# Patient Record
Sex: Male | Born: 1940 | Race: White | Hispanic: No | Marital: Single | Smoking: Former smoker
Health system: Southern US, Community
[De-identification: ages and names within clinical notes are randomized; demographics above are authoritative.]

## PROBLEM LIST (undated history)

## (undated) DIAGNOSIS — Z973 Presence of spectacles and contact lenses: Secondary | ICD-10-CM

## (undated) DIAGNOSIS — M1990 Unspecified osteoarthritis, unspecified site: Secondary | ICD-10-CM

## (undated) DIAGNOSIS — A809 Acute poliomyelitis, unspecified: Secondary | ICD-10-CM

## (undated) DIAGNOSIS — R509 Fever, unspecified: Secondary | ICD-10-CM

## (undated) DIAGNOSIS — G14 Postpolio syndrome: Secondary | ICD-10-CM

## (undated) DIAGNOSIS — I10 Essential (primary) hypertension: Secondary | ICD-10-CM

## (undated) DIAGNOSIS — G8220 Paraplegia, unspecified: Secondary | ICD-10-CM

## (undated) DIAGNOSIS — R238 Other skin changes: Secondary | ICD-10-CM

## (undated) HISTORY — PX: COLONOSCOPY: SHX174

## (undated) HISTORY — PX: URETHRAL DIVERTICULECTOMY: SHX2618

## (undated) HISTORY — DX: Essential (primary) hypertension: I10

## (undated) HISTORY — PX: APPENDECTOMY: SHX54

## (undated) HISTORY — DX: Presence of spectacles and contact lenses: Z97.3

## (undated) HISTORY — DX: Postpolio syndrome: G14

## (undated) HISTORY — PX: NECK SURGERY: SHX720

## (undated) HISTORY — DX: Fever, unspecified: R50.9

## (undated) HISTORY — PX: SPINE SURGERY: SHX786

## (undated) HISTORY — PX: TONSILLECTOMY AND ADENOIDECTOMY: SUR1326

## (undated) HISTORY — DX: Unspecified osteoarthritis, unspecified site: M19.90

## (undated) HISTORY — DX: Other skin changes: R23.8

## (undated) HISTORY — DX: Acute poliomyelitis, unspecified: A80.9

---

## 1998-11-29 ENCOUNTER — Emergency Department (HOSPITAL_COMMUNITY): Admission: EM | Admit: 1998-11-29 | Discharge: 1998-11-29 | Payer: Self-pay | Admitting: Emergency Medicine

## 1998-12-01 ENCOUNTER — Encounter: Payer: Self-pay | Admitting: Emergency Medicine

## 1998-12-01 ENCOUNTER — Emergency Department (HOSPITAL_COMMUNITY): Admission: EM | Admit: 1998-12-01 | Discharge: 1998-12-01 | Payer: Self-pay | Admitting: Emergency Medicine

## 1998-12-04 ENCOUNTER — Emergency Department (HOSPITAL_COMMUNITY): Admission: EM | Admit: 1998-12-04 | Discharge: 1998-12-04 | Payer: Self-pay | Admitting: Emergency Medicine

## 2000-10-25 ENCOUNTER — Other Ambulatory Visit: Admission: RE | Admit: 2000-10-25 | Discharge: 2000-10-25 | Payer: Self-pay | Admitting: Urology

## 2000-10-25 ENCOUNTER — Encounter (INDEPENDENT_AMBULATORY_CARE_PROVIDER_SITE_OTHER): Payer: Self-pay | Admitting: Specialist

## 2001-04-10 ENCOUNTER — Ambulatory Visit (HOSPITAL_COMMUNITY): Admission: RE | Admit: 2001-04-10 | Discharge: 2001-04-10 | Payer: Self-pay | Admitting: Gastroenterology

## 2001-04-10 ENCOUNTER — Encounter (INDEPENDENT_AMBULATORY_CARE_PROVIDER_SITE_OTHER): Payer: Self-pay | Admitting: Specialist

## 2003-03-03 ENCOUNTER — Encounter: Admission: RE | Admit: 2003-03-03 | Discharge: 2003-03-03 | Payer: Self-pay | Admitting: Neurology

## 2003-03-10 ENCOUNTER — Ambulatory Visit (HOSPITAL_BASED_OUTPATIENT_CLINIC_OR_DEPARTMENT_OTHER): Admission: RE | Admit: 2003-03-10 | Discharge: 2003-03-10 | Payer: Self-pay | Admitting: Neurology

## 2004-07-05 ENCOUNTER — Ambulatory Visit (HOSPITAL_COMMUNITY): Admission: RE | Admit: 2004-07-05 | Discharge: 2004-07-05 | Payer: Self-pay | Admitting: Gastroenterology

## 2006-08-11 ENCOUNTER — Inpatient Hospital Stay (HOSPITAL_COMMUNITY): Admission: EM | Admit: 2006-08-11 | Discharge: 2006-08-29 | Payer: Self-pay | Admitting: Emergency Medicine

## 2006-08-23 ENCOUNTER — Ambulatory Visit: Payer: Self-pay | Admitting: Physical Medicine & Rehabilitation

## 2006-10-24 ENCOUNTER — Ambulatory Visit (HOSPITAL_COMMUNITY): Admission: RE | Admit: 2006-10-24 | Discharge: 2006-10-24 | Payer: Self-pay | Admitting: Geriatric Medicine

## 2007-04-16 ENCOUNTER — Encounter: Admission: RE | Admit: 2007-04-16 | Discharge: 2007-05-08 | Payer: Self-pay | Admitting: Family Medicine

## 2007-10-07 ENCOUNTER — Encounter: Admission: RE | Admit: 2007-10-07 | Discharge: 2007-10-07 | Payer: Self-pay | Admitting: Neurosurgery

## 2008-01-02 ENCOUNTER — Encounter: Admission: RE | Admit: 2008-01-02 | Discharge: 2008-02-24 | Payer: Self-pay | Admitting: Neurology

## 2010-05-05 ENCOUNTER — Emergency Department (HOSPITAL_COMMUNITY): Admission: EM | Admit: 2010-05-05 | Discharge: 2009-06-09 | Payer: Self-pay | Admitting: Emergency Medicine

## 2010-08-14 LAB — PROTIME-INR
INR: 2.95 — ABNORMAL HIGH (ref 0.00–1.49)
Prothrombin Time: 30.5 seconds — ABNORMAL HIGH (ref 11.6–15.2)

## 2010-10-14 NOTE — Discharge Summary (Signed)
NAME:  CATALDO, COSGRIFF               ACCOUNT NO.:  [id number]   MEDICAL RECORD NO.:  [id number]          PATIENT TYPE:  INP   LOCATION:  3030                         FACILITY:  [facility]   PHYSICIAN:  Michelene Gardener, MD    DATE OF BIRTH:  1941/01/21   DATE OF ADMISSION:  08/10/2006  DATE OF DISCHARGE:  08/29/2006                               DISCHARGE SUMMARY   Primary physician is Anna Genre. Little, M.D.   DISCHARGE DIAGNOSES:  1. Multilevel spinal stenosis, status post neurosurgical surgery.  2. Aspiration pneumonia.  3. Dysphagia.  4. Hypertension.  5. Cord compression syndrome.  6. Benign prostatic hypertrophy.  7. History of poliomyelitis diagnosed at age 62-1/2 years, and he did      wear braces for a brief period of time and he had significant      muscle wasting related to this disorder.   DISCHARGE MEDICATIONS:  1. Albuterol inhaler one puff q.4.h. p.r.n.  2. Cyclosporine eye drops one drop to both eyes q.12h. for one week.  3. Ensure 113 mL p.o. three times daily.  4. Lisinopril 20 mg p.o. once daily.  5. Multivitamin one tablet p.o. once daily.  6. Protonix 40 mg p.o. once daily.  7. Levaquin 500 mg p.o. once daily x5 days.  8. Artificial tears one drop both eyes three times daily p.r.n.  9. Percocet 5/325 mg p.o. q.4h. p.r.n.  10.Finasteride 5 mg p.o. once daily.   CONSULTATIONS:  1. Neurology consultation with Melvyn Novas, M.D.  2. Neurosurgical consultation with Cristi Loron, M.D.   FOLLOW-UP APPOINTMENT:  1. With the primary physician, Dr. Catha Gosselin, in 1-2 weeks.  2. Neurosurgery, Dr. Tressie Stalker, in 1-2 weeks.   PROCEDURES:  Extensive anterior four-level anterior cervical diskectomy  and decompression with a plate from C3 up to C7, done by Dr. Tressie Stalker on March 20.   RADIOLOGY STUDIES DURING HOSPITALIZATION:  1. A CT scan of the head without contrast done on March 14, and it      showed multilevel degenerative changes with moderate  spinal      stenosis at multiple levels with no evidence of fracture.  2. CT scan of the spine done on March 14 showed the same finding with      multilevel degenerative changes consistent with moderate spinal      stenosis at multiple levels.  3. CT scan of the head without contrast showed a small right frontal      scalp hematoma without skull fracture or intracranial bleed.  4. MRI of the spine without contrast done on March 15.  It showed      central canal stenosis with severe narrowing at C4-5, C5-6, and to      a lesser degree in C3-4, C6-7.  5. Chest x-ray done on March 20 showed no active disease.  6. C-spine x-ray done on March 20 showed anterior diskectomy with      fusion from C3 up to C7.  7. Swallowing evaluation done on March 24, and the patient was started      on pureed diet.  8. Abdominal x-ray on March 25 showed a feeding tube in place.  9. Chest x-ray on March 28 that showed no acute findings with evidence      of thoracolumbar scoliosis.  10.Repeat swallowing study done on March 28 and, again, the patient      was advised on pureed.  11.Chest portable done on March 30 that showed a right infiltrate.   DIET:  A pureed diet.   ACTIVITY:  Physical therapy.   COURSE OF HOSPITALIZATION BY MEDICAL PROBLEM:  Problem 1.  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  Prior to his admission he was having acute quadriparesis and severe  weakness in his arms and other symptoms which are consistent with  central cord syndrome.  The patient was presented to the hospital, was  admitted and evaluated with multiple imaging including CT scan and MRI  of his spine, and the results were mentioned above.  The patient was  evaluated by neurology and neurosurgery and then was taken to the OR,  where he had extensive anterior  cervical diskectomy with decompression  from C3 up to C7.  He also had anterior cervical plate in the same  levels.  The patient was monitored in the hospital, had swallow speech  done twice on him, where he passed the test and was started on pureed  diet.  We will recommend to give him a strict pureed diet because of the  risk of aspiration.  The patient was also started on physical therapy.  Today he was cleared by neurosurgery to go a nursing facility, and he  will be discharged on the above-mentioned medications.   Problem 2.  ASPIRATION PNEUMONIA:  As mentioned, the patient had two  swallowing studies.  The first one was done on March 24 and the second  one was on March 28.  He was recommended to have a pureed diet but he  still developed aspiration pneumonia.  The patient remained afebrile for  a few days and his white count is normal.  We recommend to give him  Levaquin for 5 more days.   Problem 3.  DYSPHAGIA:  As mentioned above, this patient underwent a  swallow evaluation and finally he was recommended a pureed diet.  We  recommend to give him a strict pureed diet for the time being because of  the risk of developing pneumonia.   Problem 4.  BENIGN PROSTATIC HYPERTROPHY:  He will continue on  finasteride.   Problem 5.  HYPERTENSION:  The patient will continue the same  medications that he was taking.      Michelene Gardener, MD  Electronically Signed     NAE/MEDQ  D:  08/29/2006  T:  08/29/2006  Job:  [id number]   cc:   Caryn Bee L. Little, M.D.  Cristi Loron, M.D.  Melvyn Novas, M.D.

## 2010-10-14 NOTE — Procedures (Signed)
[facility]  Patient:    ELIHUE, EBERT Visit Number: [id number] MRN: [id number]          Service Type: END Location: ENDO Attending Physician:  Louie Bun Proc. Date: 04/10/01 Admit Date:  04/10/2001   CC:         Caryn Bee L. Little, M.D.   Procedure Report  PROCEDURE:  Colonoscopy with polypectomy.  INDICATION FOR PROCEDURE:  Screening colonoscopy.  DESCRIPTION OF PROCEDURE:  The patient was placed in the left lateral decubitus position and placed on the pulse monitor with continuous low-flow oxygen delivered by nasal cannula.  He was sedated with 80 mg of IV Demerol and 8 mg of IV Versed.  The Olympus video colonoscope was inserted into the rectum and advanced to the cecum, confirmed by transillumination at McBurneys point and visualization of the ileocecal valve and appendiceal orifice.  The prep was good.  The cecum appeared normal.  Within the ascending colon, there was seen a 1 cm sessile polyp which was fulgurated by hot biopsy.  The remainder of the ascending colon appeared normal.  Within the transverse colon, there was seen a 1 cm sessile polyp which was fulgurated by snare.  The remainder of the transverse colon as well as the descending, sigmoid, and rectum appeared normal with the exception of some fibrosis at about 20 cm from an old surgical anastomosis.  The patient said this was due to diverticulitis. No diverticula were seen.  The rectum distal to the anastomosis appeared normal, and retroflex view of the anus revealed no obvious internal hemorrhoids.  The colonoscope was then withdrawn, and the patient returned to the recovery room in stable condition.  He tolerated the procedure well, and there were no immediate complications.  IMPRESSION:  Ascending and sigmoid colon polyps.  PLAN:  Await histology for determination of method and interval for future colon screening. Attending Physician:  Louie Bun DD:   04/10/01 TD:  04/10/01 Job: [id number]

## 2010-10-14 NOTE — Consult Note (Signed)
NAME:  Damon Colon, LANCE NO.:  [id number]   MEDICAL RECORD NO.:  [id number]          PATIENT TYPE:  INP   LOCATION:  2102                         FACILITY:  [facility]   PHYSICIAN:  Melvyn Novas, M.D.  DATE OF BIRTH:  10-Oct-1940   DATE OF CONSULTATION:  DATE OF DISCHARGE:                                 CONSULTATION   This is a 70 year old gentleman with a history of post polio syndrome  who is followed by Dr. Sandria Manly at [facility].  The patient has been very active  and is a regular exerciser.  He had developed very good upper extremity  strength and fell yesterday, as he put it, flat on his face.  He  received in his fall a forehead bruise and immediately experienced upper  extremity pain in the form of electric shocks radiating down the neck  and spine into his extremities and hands.  He felt weaker, and every  time he touched something with his palms, this electric sensation would  return.  He was brought to the hospital with a neck collar, and a CT  scan of the C-spine was obtained showing degenerative joint disease but  no cord signal that could be distinguished.  A CT scan of the brain  showed the frontal area to suffer a hematoma.  No intracerebral bleeds.  An MRI of the C-spine was later obtained that night that shows  degenerative disk disease from C3 to C7 and actually pretty much  generalized osteophytes and very narrow nerve foramina, but no evulsion  of a nerve __________  and no contusion of the spine could be seen with  and without contrast.   PAST MEDICAL HISTORY:  The patient has had lumbar and thoracic spine  surgeries in the past.  He was followed by a hospital in [facility] when he was in his teens.  He states he  never had any metal implants, such as fusion hardware implanted.  He has  a history of sigmoiditis, urinary retention, and lower extremity atrophy  related to post polio syndrome.   MEDICATIONS:  Medications at home  include finasteride and an ACE  inhibitor.  Here, he is on Protonix and Solu-Medrol started by pharmacy  protocol for spinal cord injuries.   PHYSICAL EXAMINATION:  Vital signs:  The patient is afebrile, alert and  oriented times 3, very pleasant, fully oriented and shows no problems  with swallowing or speech.  He has a heart rate of 76, which earlier  when I evaluated him was 88 and remains in normal sinus rhythm.  Respiratory rate is between 16 and 20 while the patient lies flat.  Blood pressure was 125/80 in bed.  Lungs:  Clear to auscultation.  No  carotid bruit.  No cardiac murmur.  Atrophy of lower extremities is  noticed as well as atrophy of the foot musculature and deformities  related to the post polio.  The patient is slender.  He is obviously in  pain but remains pleasant.  Cranial nerve examination shows no  abnormalities.  There is no numbness over the face.  Full extraocular  movements.  No facial droop and no visual field impairment.  No  pupillary asymmetry.  Tongue and uvula are midline.  Motor examination  shows very weak lower extremities, upper extremity pain when palm is  touched or fingers are bent.  No ability to extend the arms without  pain.  Decreased triceps and decreased biceps reflexes bilaterally.  Antebrachial reflex is trace present.  No Babinski can be elicited.  Sensory exam in both arms to fine touch and pinprick shows that the  patient feels equally numb and confirms that his fingertips are numb,  too.  At the spinal cord level, there seems to be a normal chest  sensation to pinprick and touch, but below the naval, decreased sensory  to fine touch.   ASSESSMENT:  The patient suffered probably cervical spine contusion or  central cord injury.  No obvious abnormalities on MRI.   We will continue with spinal cord protocol, but add Lyrica for reduction  of pain.  The patient's further rehab and PT potential will need to be  evaluated.  Remains in neck  collar.      Melvyn Novas, M.D.  Electronically Signed     CD/MEDQ  D:  08/11/2006  T:  08/11/2006  Job:  [id number]

## 2010-10-14 NOTE — Op Note (Signed)
NAME:  Damon Colon, Damon Colon NO.:  [id number]   MEDICAL RECORD NO.:  [id number]          PATIENT TYPE:  INP   LOCATION:  2915                         FACILITY:  [facility]   PHYSICIAN:  Cristi Loron, M.D.DATE OF BIRTH:  Nov 02, 1940   DATE OF PROCEDURE:  08/16/2006  DATE OF DISCHARGE:                               OPERATIVE REPORT   BRIEF HISTORY:  The patient is a 70 year old white male who has had  polio and post pulse polio syndrome with some weakness in his legs.  Lately has had more difficulty with ambulation and falling.  He took a  fall about a week ago and had acute onset of quadriparesis and severe  weakness of his arms, consistent with central cord syndrome.  He was  worked up with cervical MRI which demonstrated severe stenosis of  multiple levels and there is evidence of a spinal cord injury.  I  discussed the various treatment options with the patient including  surgery.  The patient weighed the risks, benefits and alternatives of  surgery and decided to proceed with a four-level anterior cervical  diskectomy fusion and plating.   PREOPERATIVE DIAGNOSIS:  C3-4, C4-5, C5-6, C6-7 spondylosis, stenosis,  cervical myelopathy, radiculopathy, cervicalgia.   POSTOPERATIVE DIAGNOSIS:  C3-4, C4-5, C5-6, C6-7 spondylosis, stenosis,  cervical myelopathy, radiculopathy, cervicalgia.   OPERATION/PROCEDURE:  1. C3-4, C4-5, C5-6, C6-7 extensive anterior cervical      diskectomy/decompression.  2. C3-4, C4-5, C5-6, C6-7 interbody arthrodesis with local autograft      bone.  3. Insertion of interbody prosthesis at C3-4, C4-5, C5-6, C6-7      (Alphatec PEEK interbody prosthesis).  4. Anterior cervical plating C3 down C7 with Codman slim lock titanium      plate and screws.   SURGEON:  Cristi Loron, M.D.   ASSISTANT:  Stefani Dama, M.D.   ANESTHESIA:  General endotracheal.   ESTIMATED BLOOD LOSS:  3 mL.   SPECIMENS:  None.   DRAINS:  One 7 mm flat  Jackson-Pratt drain in the prevertebral space.   COMPLICATIONS:  None.   PROCEDURE:  The patient was brought to the operating room by anesthesia  team.  General endotracheal anesthesia was induced.  The patient was  carefully induced.  The patient remained in the supine position.  A roll  was placed under shoulders to place his neck in slight extension.  The  anterior cervical region was then prepared with Betadine scrub and  Betadine solution.  Sterile drapes were applied.  I injected the area to  be incised with Marcaine with epinephrine solution.  Used a scalpel to  make a transverse incision in the patient's left anterior neck.  I used  the Metzenbaum scissors to divide the platysma muscle and then to  dissect medial to the sternocleidomastoid muscle, jugular vein and a  carotid artery.  I carefully dissected down towards the anterior  cervical spine, identifying the esophagus and  retracted it medially.  We then cleared soft tissue from the anterior cervical spine using the  Kitner swabs.  We then inserted a bent spinal needle into  and exposed  intervertebral space and then obtained intraoperative radiograph to  confirm our location.  We then used the electrocautery to detach the  medial border of the longus colli muscle bilaterally from C3-4, C4-5, C5-  6, and C6-7  intervertebral disk spaces.  We inserted the Caspar self-  retaining retractor for exposure.  There was quite a bit of ventral  spondylosis that had to be removed prior to the diskectomy.  We used a  high-speed drill to drill out these ventral bone spurs.  We began the  decompression at C4-5.  We incised C4-5 intervertebral disk with 15-  blade scalpel.  We performed partial diskectomy using pituitary forceps.  We then inserted distraction screws at C4 and C5, distracted the  interspace and then we used a high-speed drill to decorticate the  vertebral end plates at Q6-5, drill away the remainder of C4-5  intervertebral  disk, to drill away the posterior spondylosis and then to  thin out the posterior longitudinal ligament.  We then incised the  ligament with arachnoid knife and removed it with Kerrison punch,  undercutting the vertebral end plates decompressing the thecal sac.  We  then performed a foraminotomy about the bilateral C5 nerve roots  completing the decompression at this level.   We then repeated this procedure in analogous fashion at C5-6 and then C6-  7, then C3-4, decompressing these levels, decompressing the spinal cord  at these levels as well as performing foraminotomies about the bilateral  C4, C6 and C7 nerve roots.  This completed the decompression.   We now turned attention to the arthrodesis and insertion of the  prosthesis.  We used the trial spacers and determined the use of medium  Alphatec PEEK interbody prosthesis.  We prefilled the prosthesis with a  combination of local autograft bone we obtained during the decompression  as well as bone graft extender.  We then inserted the appropriate size  prosthesis into the distracted interspaces at C3-4, C4-5, C5-6, and C6-  7.  We did this sequentially.  We then removed the distraction screws.  There was good snug fit of the prosthesis in each level.  We then turned  our attention to anterior spinal instrumentation.  We used a high-speed  drill to remove more of the ventral spondylosis at C3-4, C4-5, C5-6, and  C6-7 so that the plate would lay down flat.  We selected appropriate  length Codman slim lock anterior cervical plate and laid it along the  anterior aspect of vertebral bodies from C3 down to C7.  We then drilled  two 14 mm holes at C3, C4, C5, C6 and C7.  We then secured the plate to  the vertebral bodies by placing two 14 mm self-tapping screws at C3, C4,  C5, C6 and C7.  We then obtained intraoperative radiograph above the  prosthesis at C5-6.  It was minimally deep but I,  therefore,  removed the plate screws and then  distracted the interspace and pulled the  prosthesis back a few millimeters and then we replaced the plate screws.  We obtained another x-ray that demonstrated good position of plate,  screws and interbody prosthesis.  We, therefore, secured these screws  and plate by locking each cam.   We then obtained hemostasis using bipolar electrocautery.  We irrigated  the wound out with bacitracin solution.  We then placed a 7 mm flat  Jackson-Pratt drain in the prevertebral soft space and then tunneled out  through a separate  stab wound.  We then removed the retractor.  We  inspected the esophagus for damage.  There was none apparent.  We then  reapproximated the patient's platysma muscle with an interrupted 3-0  Vicryl suture, the subcutaneous tissue with interrupted 3-0 Vicryl  suture and skin with Steri-Strips and Benzoin.  This was then coated  with bacitracin ointment, sterile dressing applied.  The drapes were  removed.  The patient was subsequently extubated by the anesthesia team  and transported to the post anesthesia care unit in stable condition.  All sponge, instrument and needle counts correct at the end of the case.  The patient's neurologic status was unchanged from her preoperative  status.   I should note that upon exposing the patient's anterior cervical spine,  we noted that there was a fracture through the disk space at C5-6 and C6-  7.      Cristi Loron, M.D.  Electronically Signed     JDJ/MEDQ  D:  08/17/2006  T:  08/17/2006  Job:  [id number]

## 2010-10-14 NOTE — H&P (Signed)
NAME:  Damon Colon, Damon Colon               ACCOUNT NO.:  [id number]   MEDICAL RECORD NO.:  [id number]          PATIENT TYPE:  INP   LOCATION:  2102                         FACILITY:  [facility]   PHYSICIAN:  Melissa L. Ladona Ridgel, MD  DATE OF BIRTH:  [birth year]   DATE OF ADMISSION:  08/11/2006  DATE OF DISCHARGE:                              HISTORY & PHYSICAL   CHIEF COMPLAINT:  Fall with upper extremity numbness.   Primary care physician is Dr. Catha Gosselin.   HISTORY OF PRESENT ILLNESS:  The patient is a 70 year old white male  with a history of polio who was walking into his home with some grocery  bags when he suddenly fell.  The patient had had alcohol earlier in the  evening but nothing more than his usual 1-2 beers.  The patient states  that he feels like his foot turned the wrong way and he fell.  This has  been a  problem for him with his current muscle deterioration related to  his polio.  Has significant muscle wasting and difficulty with walking .  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  The patient  did not recall losing consciousness and his sister, with whom he lives,  did not mention loss of consciousness.  The patient was seen in the  emergency room and found to have inability to move his upper extremities  and the complaint of numbness.  Neurology was consulted and it was  determined that the patient should be admitted to the hospitalist  service but should be treated with a bolus of steroids.  [facility] were requested for consults.   REVIEW OF SYSTEMS:  Deteriorating ankle strength secondary to his polio.  Left leg clinically is worse than the right.  He denies any headache or  visual changes recently.  He denies diarrhea, constipation and in  general does not sleep well.   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  He does have significant  muscle wasting related to his disorder.  2. BPH.  3. Hypertension.   PAST SURGICAL HISTORY:  Diverticulitis.  He does have a history of  colectomy in the past.   SOCIAL HISTORY:  He quit tobacco a couple of years ago.  He occasionally  will drink a couple of beers a day.  He retired from being a Comptroller.   FAMILY HISTORY:  Mom had cancer, cholangiocarcinoma and is deceased.  Dad had cancer of the lung and hypertension and is deceased.  He has 2  sisters NKD.   MEDICATIONS:  1. Finasteride 5 mg daily  2. Lisinopril 10 mg daily.  3. Multivitamin with no potassium.  4. Aspirin 81 mg.   ALLERGIES:  No known drug allergies.   VITAL SIGNS:  Temperature 100.9, blood pressure 98/54, pulse 64,  respirations 16, saturation 99%.  GENERAL:  This is a very pleasant white male with mild to moderate  stress secondary to dysesthesia and shock-like  waves coming down his  arms.  He has positive trauma to the right frontal part of his forehead  with repaired lesion secondary to trauma.  HEENT:  Pupils equal, round and reactive to light.  Extraocular muscles  intact.  Mucous membranes dry.  NECK:  Minimally tender and range of motion could not be assessed at  this time secondary to the distinct pain that occurred with movement of  the neck.  NEUROVASCULAR:  Decreased but clear.  There is no sensory cut off level  noted on neurological examination.  CARDIOVASCULAR:  Regular rate and rhythm.  Positive S1, S2, no S3.  No  murmurs, rubs or gallops.  ABDOMEN:  Soft, nontender, nondistended with positive bowel sounds.  EXTREMITIES:  Significant muscle wasting in the bilateral lower  extremities with some contractures of the toes.  Some side-to-side  movement is noted in the lower extremities but no anti-gravity movement  in terms of flexion/extension can be obtained at baseline.  He does have  a history of inability to lift the legs off the bed and he describes his  walking gait as sort of  throwing his legs.  Currently his upper  extremities show right greater than left, very minor movement ability  with sort of a side-to-side rocking motion if at all.  There is trace  grip in both hands and he is having quite significant dysesthesia with  touch and even without touch having lightning shock down the arms.  He  can shrug his shoulders slightly and does have some side-to-side motion  with his head but this is barely.  The deep tendon reflexes are  difficult to assess secondary to pain in the upper extremities and  really not able to elicit any reflexes but he states that his baseline  status usually has no reflexes.   CT scan shows multilevel degenerative changes with stenosis on multiple  levels, which is described as mild to moderate.  There is no skull  fracture and there is diffuse cerebral atrophy.  No labs were sent in the emergency room.  The patient was able to void  x1 with some difficulty.  He states that, in general, he does have  trouble voiding when laying down.   ASSESSMENT/PLAN:  This is a 70 year old white male status post a fall  with trauma to the forehead and presumed spinal injury with symptoms of  spinal shock.  CT scan shows no fractures and small vessel disease is  present intracranially with multiple areas of stenosis in his neck.  There is no subluxation or soft tissue injury.  MRI is completed and I  did speak with the radiologist regarding the preliminary interpretation.  He states there is no signal abnormality but there is severe stenosis at  multiple levels in the cervical spine.  1. Spinal injury localizing to possibly C6-C7 and indicative more of a      central cord type lesion.  He has bilateral upper extremity and      sensory motor dysfunction.  A steroid bolus has been ordered by      neurology.  At this time we will admit him to Intensive Care Unit     with frequent neurological checks.  I presume that after neurology      examines him  that a neurosurgical consult may be indicated.  2. Cardiovascular.  History of hypertension.  His blood pressure is      variable at this time, possibly related to his underlying injury.  We will hold his ACE inhibitor for now, monitor closely.  3. Pulmonary.  No difficulty swallowing or breathing.  Continue to      watch him and for now I will keep him NPO and monitor his pulmonary      status closely.  4. Gastrointestinal.  History of hiatal hernia, so while he is on      steroids, I will add Protonix.  5. Genitourinary.  Currently he has no priapism and even though he is      having trouble urinating, this is somewhat a baseline for him when      he is lying down, therefore, we will use a p.r.n. Foley order.  6. Endocrine.  While he is on steroids, we may wish to monitor his      capillary blood glucoses.  7. Neurologically.  I will resume his collar.  Neurology will see him      this morning.  For now he will have a bolus of steroids and then      steroids per the [facility] protocol for spinal shock.  The patient,      potentially, may need to see      neurosurgery.  8. Slight fever.  Check a UA, CBC and monitor him.  For further      temperature spikes he may need a chest x-ray if this persists.  I      suppose aspiration is possible.      Melissa L. Ladona Ridgel, MD  Electronically Signed     MLT/MEDQ  D:  08/11/2006  T:  08/12/2006  Job:  [id number]   cc:   Caryn Bee L. Little, M.D.  Melvyn Novas, M.D.

## 2010-12-16 ENCOUNTER — Ambulatory Visit (INDEPENDENT_AMBULATORY_CARE_PROVIDER_SITE_OTHER): Payer: Medicare Other | Admitting: Surgery

## 2010-12-16 ENCOUNTER — Encounter (INDEPENDENT_AMBULATORY_CARE_PROVIDER_SITE_OTHER): Payer: Self-pay | Admitting: Surgery

## 2010-12-16 VITALS — BP 126/82 | HR 88 | Temp 97.2°F | Ht 64.5 in | Wt 131.0 lb

## 2010-12-16 DIAGNOSIS — L723 Sebaceous cyst: Secondary | ICD-10-CM

## 2010-12-16 DIAGNOSIS — L089 Local infection of the skin and subcutaneous tissue, unspecified: Secondary | ICD-10-CM

## 2010-12-16 NOTE — Progress Notes (Signed)
Subjective:     Patient ID: Damon Colon, male   DOB: Mar 27, 1941, 70 y.o.   MRN: [id number]  HPIHistory of multiple decades increasing sebaceous cyst on left shoulder   Review of Systems     Objective:   Physical Exam    The area was about the size of a silver dollar, it was raised and had multiple areas of spontaneous drainage of yellow white sebaceous core along with grayish pus.  Careful and gentle pressure enabled me to extract this with manipulation. The abscess was packed with iodophor gauze. Assessment:     Chronic/acute sebaceous abscess of left shoulder    Plan:     Continue Keflex as ordered by Dr. Clarene [facility].  Return to see me in 1 week

## 2010-12-16 NOTE — Patient Instructions (Signed)
Remove packing tomorrow afternoon and shower.  Dress with Neosporin and dry gauze to collect drainage. Continue to take Keflex as ordered by Dr. Clarene [facility] Followup here at [facility] in 1 week.

## 2010-12-30 ENCOUNTER — Encounter (INDEPENDENT_AMBULATORY_CARE_PROVIDER_SITE_OTHER): Payer: Self-pay | Admitting: Surgery

## 2010-12-30 ENCOUNTER — Ambulatory Visit (INDEPENDENT_AMBULATORY_CARE_PROVIDER_SITE_OTHER): Payer: Medicare Other | Admitting: Surgery

## 2010-12-30 VITALS — Temp 97.6°F

## 2010-12-30 DIAGNOSIS — L089 Local infection of the skin and subcutaneous tissue, unspecified: Secondary | ICD-10-CM

## 2010-12-30 DIAGNOSIS — L723 Sebaceous cyst: Secondary | ICD-10-CM

## 2010-12-30 NOTE — Progress Notes (Signed)
Mr. Krakowski returns today in the area where I had cored out this infected sebaceous cyst is healing nicely. I was able to retrieve 2 pieces of the cyst wall and I'll see any other foreign material down in its depths. I hopefully will now complete healing. I'll see him back in 6 weeks to reassess. Doing well

## 2011-02-17 ENCOUNTER — Encounter (INDEPENDENT_AMBULATORY_CARE_PROVIDER_SITE_OTHER): Payer: Medicare Other | Admitting: Surgery

## 2011-02-22 ENCOUNTER — Ambulatory Visit (INDEPENDENT_AMBULATORY_CARE_PROVIDER_SITE_OTHER): Payer: Medicare Other | Admitting: Surgery

## 2011-02-22 ENCOUNTER — Encounter (INDEPENDENT_AMBULATORY_CARE_PROVIDER_SITE_OTHER): Payer: Self-pay | Admitting: Surgery

## 2011-02-22 VITALS — BP 118/70 | HR 64 | Temp 97.8°F | Ht 64.0 in | Wt 132.0 lb

## 2011-02-22 DIAGNOSIS — L723 Sebaceous cyst: Secondary | ICD-10-CM

## 2011-02-22 NOTE — Progress Notes (Signed)
The drained sebaceous cyst on his neck has healed.  I don't think that anything else needs to be done to this area.  Will see PRN

## 2012-10-02 ENCOUNTER — Telehealth: Payer: Self-pay | Admitting: Neurology

## 2012-10-02 NOTE — Telephone Encounter (Signed)
Patient's daughter took message for father (patient) to return call for reschedulng appt.

## 2012-10-07 ENCOUNTER — Telehealth: Payer: Self-pay | Admitting: *Deleted

## 2012-10-07 NOTE — Telephone Encounter (Signed)
Damon Colon want a 11:30 appointment.

## 2012-10-08 ENCOUNTER — Other Ambulatory Visit: Payer: Self-pay | Admitting: Family Medicine

## 2012-10-08 DIAGNOSIS — R0989 Other specified symptoms and signs involving the circulatory and respiratory systems: Secondary | ICD-10-CM

## 2012-10-10 ENCOUNTER — Ambulatory Visit
Admission: RE | Admit: 2012-10-10 | Discharge: 2012-10-10 | Disposition: A | Discharge status: Home / Self Care | Payer: Medicare Other | Source: Ambulatory Visit | Attending: Family Medicine | Admitting: Family Medicine

## 2012-10-10 DIAGNOSIS — R0989 Other specified symptoms and signs involving the circulatory and respiratory systems: Secondary | ICD-10-CM

## 2012-10-15 ENCOUNTER — Ambulatory Visit: Payer: Self-pay | Admitting: Neurology

## 2013-06-05 ENCOUNTER — Ambulatory Visit: Payer: Self-pay | Admitting: Neurology

## 2013-09-12 ENCOUNTER — Encounter: Payer: Self-pay | Admitting: *Deleted

## 2013-09-17 ENCOUNTER — Encounter (INDEPENDENT_AMBULATORY_CARE_PROVIDER_SITE_OTHER): Payer: Self-pay

## 2013-09-17 ENCOUNTER — Encounter: Payer: Self-pay | Admitting: Neurology

## 2013-09-17 ENCOUNTER — Ambulatory Visit (INDEPENDENT_AMBULATORY_CARE_PROVIDER_SITE_OTHER): Payer: Medicare Other | Admitting: Neurology

## 2013-09-17 VITALS — BP 110/69 | HR 122 | Resp 16

## 2013-09-17 DIAGNOSIS — M4712 Other spondylosis with myelopathy, cervical region: Secondary | ICD-10-CM

## 2013-09-17 DIAGNOSIS — M4120 Other idiopathic scoliosis, site unspecified: Secondary | ICD-10-CM

## 2013-09-17 DIAGNOSIS — B91 Sequelae of poliomyelitis: Secondary | ICD-10-CM

## 2013-09-17 DIAGNOSIS — G14 Postpolio syndrome: Secondary | ICD-10-CM

## 2013-09-17 DIAGNOSIS — G959 Disease of spinal cord, unspecified: Secondary | ICD-10-CM

## 2013-09-17 HISTORY — DX: Postpolio syndrome: G14

## 2013-09-17 MED ORDER — PREDNISONE 10 MG PO TABS
10.0000 mg | ORAL_TABLET | Freq: Every day | ORAL | Status: DC
Start: 2013-09-17 — End: 2013-09-18

## 2013-09-17 NOTE — Progress Notes (Signed)
[facility]  Provider:  Melvyn Novas, M D  Referring Provider: Catha Gosselin, MD Primary Care Physician:  Mickie Hillier, MD  Chief Complaint  Patient presents with  . Follow-up    Room 10  . Neurologic Problem    HPI:  Damon Colon is a 73 y.o. caucasian , wheelchair bound male , seen here as a revisit for spinal stenosis , myelopathy.  I believe our last visit was just 4 years ago.   Has been a long-time established patient of practice. His first [facility] outpatient clinic visit was on 12-07-1998 with Dr Sandria Manly . Damon Colon had Polio at the age of 3 in the year 1945, than involving all 4 extremities and paraspinal muscles he [facility] [place] and  later used long-leg braces. He was able to use a crutch  to support his right leg - but his left leg still needed braces for years thereafter.  Four over three decades he was able to walk without a cane or crutch but had a number of falls.  Unfortunately, on 7-to-2000 at night he fell down a flight of stairs but his left leg gave way. He strike his left 4 out loss of consciousness. He injured his right elbow and was seen at the rest in the hospital emergency room. Initially he complained of a scratchy sensation as if sand paper covered his fingers in both hands,  which subsequently slowly improved . Than he be begun developing neck stiffness bowel and bladder incontinence were not an initial complaint but the electric shock sensation whenever her bending and flexing at the neck. In 2006 or 2007 he had another fall in his kitchen -he went to [facility] home for rehab.  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 He had been unable to move but was not in pain after his fall. He has progressed to wheelchair dependence.       Review of Systems: Out  of a complete 14 system review, the patient complains of only the following symptoms, and all other reviewed systems are negative. Epworth  2 , FSS 10,  Damon Colon has lost dexterity in both hands and therefore is not able to operate a pen, his handwriting therefore was deferred.  He is not a tobacco user he is not drinking alcohol and is not using recreational drugs. Damon Colon had no recent change in his visual acuity it is not all the last 12 months, his respirations have been regular he does not have talking but occasionally a postnasal drip. No palpitations no gastrointestinal problems . He is no longer adhering to external circadian structure and likes to sleep in his recliner.    History   Social History  . Marital Status: Single    Spouse Name: N/A    Number of Children: 0  . Years of Education: College   Occupational History  . Not on file.   Social History Main Topics  . Smoking status: Former Games developer  . Smokeless tobacco: Never Used     Comment: 1981  . Alcohol Use: 4.2 oz/week    7 Cans of beer per week  . Drug Use: No  . Sexual Activity: Not on file   Other Topics Concern  . Not on file   Social History Narrative   Patient is single and  lives with his sister.   Patient is retired.   Patient drinks a half cup to one cup of caffeine daily.   Patient is right-handed.   Patient has a college education.    History reviewed. No pertinent family history.  Past Medical History  Diagnosis Date  . Fever     102  . Bruises easily   . Wears glasses   . Arthritis   . Hypertension   . Polio     Past Surgical History  Procedure Laterality Date  . Spine surgery      3-stage spine fusion  . Urethral diverticulectomy    . Appendectomy    . Tonsillectomy and adenoidectomy    . Neck surgery      c7- titanium plates put in    Current Outpatient Prescriptions  Medication Sig Dispense Refill  . acetaminophen (TYLENOL) 650 MG CR tablet Take 650 mg by mouth 2 (two)  times daily.        Marland Kitchen. COUMADIN 4 MG tablet Monday, Wednesday and Friday he takes 4 mg, the other days he takes 3 mg      . finasteride (PROSCAR) 5 MG tablet Daily.      Marland Kitchen. lisinopril (PRINIVIL,ZESTRIL) 5 MG tablet Daily.      . nitrofurantoin (MACRODANTIN) 50 MG capsule BID times 48H.      Marland Kitchen. Polyethylene Glycol 3350 (MIRALAX PO) Take by mouth as needed.        . traMADol (ULTRAM) 50 MG tablet 4 times daily.      Marland Kitchen. trimethoprim (TRIMPEX) 100 MG tablet       . predniSONE (DELTASONE) 10 MG tablet Take 1 tablet (10 mg total) by mouth daily with breakfast. Take 3 in AM for 3 days, than 2 in AM for 6 days and than one tab 12 days.  48 tablet  0   No current facility-administered medications for this visit.    Allergies as of 09/17/2013 - Review Complete 09/17/2013  Allergen Reaction Noted  . Codeine  12/16/2010    Vitals: BP 110/69  Pulse 122  Resp 16 Last Weight:  Wt Readings from Last 1 Encounters:  02/22/11 132 lb (59.875 kg)   Last Height:   Ht Readings from Last 1 Encounters:  02/22/11 5\' 4"  (1.626 m)    Physical exam:  General: The patient is awake, alert and appears not in acute distress. The patient is well groomed. Head: Normocephalic, atraumatic. Neck is supple. Mallampati 3 , neck circumference: 16 , Cardiovascular:  Regular rate and rhythm, without  murmurs or carotid bruit, and without distended neck veins. Respiratory: Lungs are clear to auscultation. Skin:  Without evidence of edema, or rash Trunk:  Slender,  Weakness of the paraspinal muscles. Scoliosis to the left.   Neurologic exam : The patient is awake and alert, oriented to place and time.  Memory subjective described as intact. There is a normal attention span & concentration ability.  Speech is fluent without  dysarthria, dysphonia or aphasia. Mood and affect are appropriate.  Cranial nerves: Pupils are equal and briskly reactive to light. Funduscopic exam without  evidence of pallor or edema.   Extraocular movements  in vertical and horizontal planes intact and without nystagmus. Visual fields by finger perimetry are intact. Hearing to finger rub intact.  Facial sensation intact to fine touch.  Facial motor strength is symmetric and tongue and uvula move midline.  Motor exam:  Atrophy of muscles in all 4 extremities. Hand atrophy. Paraspinal muscles atrophied,  too allowing the scoliosis at the thoracic level to the left and cervical lordosis.  Sensory:  Fine touch, pinprick and vibration were affected in all finger tips and toes.   Coordination: Rapid alternating movements in the fingers is not longer possible.  Gait and station: Patient is wheelchair bound.  Assessment:  After physical and neurologic examination, review of laboratory studies, imaging, neurophysiology testing and pre-existing records, assessment is  1) spinal stenosis, myelopathy and post polio syndrome.    Plan:  Treatment plan and additional workup : 1) I would like for Mr Malen Colon to resume gait exercises with a harness, double bar support, two times a week.   patient lives in [facility] [place] , [facility]. Right shoulder pain, since he fell. He used deep heat with little success. Brace for the left hand to fix the thumb in extended position.  His pain responds to Steroids and ultram.

## 2013-09-17 NOTE — Patient Instructions (Signed)
Post-Polio Syndrome Post-Polio Syndrome (PPS) is a condition that can strike polio survivors anywhere from 10 to 40 years after their recovery from polio. PPS is caused by the death of individual nerve terminals in the motor units that remain after the initial polio attack. Doctors estimate the incidence of PPS at about 25 percent of the survivor population.  SYMPTOMS  Symptoms include:  Fatigue.  Slowly progressive muscle weakness.  Muscle and joint pain.  Muscular atrophy. The severity of PPS depends upon how seriously the survivors were affected by the first polio attack. DIAGNOSIS  The only way to be sure a person has PPS is through a neurological exam. This is aided by other lab studies. Examples include:  Magnetic resonance imaging (MRI).  Neuroimaging.  Electrophysiological studies.  Muscle biopsies.  Spinal fluid analysis. TREATMENT At present, no treatment can cure or prevent PPS. Some experimental drug treatments show promise in treating symptoms. These include pyridostigmine and selegiline. Caregivers recommend that polio survivors follow standard healthful lifestyle practices:   Eat a healthy diet.  Exercise in moderation.  Visit a caregiver regularly. PPS is a slowly progressing condition. It is marked by long periods of stability. PPS patients, compared with control groups, do not show increased antibodies against the polio virus. PPS affects only certain muscle groups. So doctors question whether the polio virus can cause a persistent infection in humans. PPS is not usually life-threatening. But it may be in people with severe respiratory impairment. Document Released: 05/05/2002 Document Revised: 08/07/2011 Document Reviewed: 05/15/2005 [facility] Patient Information 2014 [place], [place].

## 2013-09-18 ENCOUNTER — Telehealth: Payer: Self-pay | Admitting: Neurology

## 2013-09-18 NOTE — Telephone Encounter (Signed)
[facility] for Prednisone.  After speaking to the physcian, she wanted the patient to take 3 tabs in the AM for 3 days, then 2 tabs in AM for 6 days, and than one tab for 12 days, but wanted a total of 36 tabs not 33.   I returned call to pharmacy and relayed correct directions.

## 2013-10-19 ENCOUNTER — Encounter: Payer: Self-pay | Admitting: Neurology

## 2013-10-19 DIAGNOSIS — M4712 Other spondylosis with myelopathy, cervical region: Secondary | ICD-10-CM

## 2013-10-19 DIAGNOSIS — G14 Postpolio syndrome: Secondary | ICD-10-CM

## 2013-10-19 DIAGNOSIS — M4722 Other spondylosis with radiculopathy, cervical region: Principal | ICD-10-CM

## 2013-10-27 ENCOUNTER — Telehealth: Payer: Self-pay | Admitting: Neurology

## 2013-10-27 NOTE — Telephone Encounter (Signed)
Patient calling to state that he was under the impression that he was going to have home service physical therapy instead of what was scheduled for him, states that it will be difficult for him to have that appointment due to the time. Please call and advise patient.

## 2013-10-27 NOTE — Telephone Encounter (Signed)
Patient was informed that the referral was sent to [facility] and they were unable to provide services to him through home PT, so the appointment was scheduled at the Outpatient rehab.  Patient was given the telephone number to re-schedule the appointment because there was a conflict of time for him.

## 2013-11-04 ENCOUNTER — Ambulatory Visit: Payer: Medicare Other

## 2014-01-01 NOTE — Telephone Encounter (Signed)
Noted  

## 2014-04-01 ENCOUNTER — Ambulatory Visit: Payer: Medicare Other | Admitting: Neurology

## 2014-04-02 ENCOUNTER — Encounter: Payer: Self-pay | Admitting: Neurology

## 2014-04-02 ENCOUNTER — Ambulatory Visit (INDEPENDENT_AMBULATORY_CARE_PROVIDER_SITE_OTHER): Payer: Medicare Other | Admitting: Neurology

## 2014-04-02 ENCOUNTER — Ambulatory Visit: Payer: Medicare Other | Admitting: Neurology

## 2014-04-02 VITALS — BP 116/66 | HR 92 | Temp 97.2°F | Resp 14 | Ht 65.0 in | Wt 138.6 lb

## 2014-04-02 DIAGNOSIS — R29898 Other symptoms and signs involving the musculoskeletal system: Secondary | ICD-10-CM

## 2014-04-02 DIAGNOSIS — S14109A Unspecified injury at unspecified level of cervical spinal cord, initial encounter: Secondary | ICD-10-CM | POA: Insufficient documentation

## 2014-04-02 DIAGNOSIS — G8250 Quadriplegia, unspecified: Secondary | ICD-10-CM

## 2014-04-02 DIAGNOSIS — N319 Neuromuscular dysfunction of bladder, unspecified: Secondary | ICD-10-CM | POA: Insufficient documentation

## 2014-04-02 DIAGNOSIS — S14109D Unspecified injury at unspecified level of cervical spinal cord, subsequent encounter: Secondary | ICD-10-CM

## 2014-04-02 MED ORDER — PREDNISONE 10 MG PO TABS: ORAL_TABLET | ORAL | Status: DC

## 2014-04-02 NOTE — Progress Notes (Signed)
[facility]  Provider:  Melvyn Novas, M D  Referring Provider: Catha Gosselin, MD Primary Care Physician:  Damon Hillier, MD  Chief Complaint  Patient presents with  . RV    Room 10, Caregiver    HPI:  Damon Colon is a 73 y.o. caucasian , wheelchair bound male , seen here as a revisit for spinal stenosis , myelopathy.  I believe our last visit was just 4 years ago.   Has been a long-time established patient of practice. His first [facility] outpatient clinic visit was on 12-07-1998 with Dr Damon Colon . 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 He was able to use a crutch  to support his right leg - but his left leg still needed braces for years thereafter. Four over three decades he was able to walk without a cane or crutch but had a number of falls. Unfortunately, on 11-28-1998 , at night he fell down a flight of stairs but his left leg gave way. He strike his left 4 out loss of consciousness. He injured his right elbow and was seen at the rest in the hospital emergency room. Initially he complained of a scratchy sensation as if sand paper covered his fingers in both hands,  which subsequently slowly improved . Than he be begun developing neck stiffness bowel and bladder incontinence were not an initial complaint but the electric shock sensation whenever her bending and flexing at the neck. In 2006 or 2007 he had another fall in his Colon -he went to [facility] home for rehab.   It was soon clear that Mr. 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 He had been unable to move but was not in pain after his fall. He has progressed to wheelchair dependence.  Mr. Damon Colon has lost dexterity in both hands and  therefore is not able to operate a pen, his handwriting therefore was deferred.  He is not a tobacco user he is not drinking alcohol and is not using recreational drugs. Mr. Ke had no recent change in his visual acuity it is not all the last 12 months, his respirations have been regular he does not have talking but occasionally a postnasal drip. No palpitations no gastrointestinal problems . He is no longer adhering to external circadian structure and likes to sleep in his recliner.    04-02-14 : The patient did not participate in PT, as he felt better. Today, we discuss the possibility of occupational therapy and heated wax therapy.    Review of Systems: Out of a complete 14 system review, the patient complains of only the following symptoms, and all other reviewed systems are negative.   History   Social History  . Marital Status: Single    Spouse Name: N/A    Number of Children: 0  . Years of Education: College   Occupational History  . Not on file.   Social History Main Topics  . Smoking status: Former Games developer  . Smokeless tobacco: Never Used     Comment: 1981  . Alcohol Use: Yes     Comment: occa  . Drug Use: No  . Sexual Activity: Not on file   Other Topics Concern  . Not on file  Social History Narrative   Patient is single and lives with his sister.   Patient is retired.   Patient drinks a half cup to one cup of caffeine daily.   Patient is right-handed.   Patient has a college education.    No family history on file.  Past Medical History  Diagnosis Date  . Fever     102  . Bruises easily   . Wears glasses   . Arthritis   . Hypertension   . Polio   . 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 Spine surgery      3-stage spine fusion  . Urethral diverticulectomy    . Appendectomy    . Tonsillectomy and adenoidectomy    . Neck surgery      c7- titanium plates put in    Current  Outpatient Prescriptions  Medication Sig Dispense Refill  . acetaminophen (TYLENOL) 650 MG CR tablet Take 650 mg by mouth 4 (four) times daily.     Damon Colon. COUMADIN 4 MG tablet Take 3 mg by mouth. Every day except Wednesday and Sundays; takes 1.5mg  (temporarily).    . finasteride (PROSCAR) 5 MG tablet Daily.    Damon Colon. lisinopril (PRINIVIL,ZESTRIL) 5 MG tablet Daily.    . Polyethylene Glycol 3350 (MIRALAX PO) Take by mouth as needed.      . traMADol (ULTRAM) 50 MG tablet 4 times daily.    Damon Colon. trimethoprim (TRIMPEX) 100 MG tablet 100 mg 2 (two) times daily.     . predniSONE (DELTASONE) 10 MG tablet take 3 tabs in the AM for 3 days, then 2 tabs in AM for 6 days, and than one tab for 12 days     No current facility-administered medications for this visit.    Allergies as of 04/02/2014 - Review Complete 04/02/2014  Allergen Reaction Noted  . Codeine  12/16/2010    Vitals: BP 116/66 mmHg  Pulse 92  Temp(Src) 97.2 F (36.2 C) (Oral)  Resp 14  Ht 5\' 5"  (1.651 m)  Wt 138 lb 9.6 oz (62.869 kg)  BMI 23.06 kg/m2 Last Weight:  Wt Readings from Last 1 Encounters:  04/02/14 138 lb 9.6 oz (62.869 kg)   Last Height:   Ht Readings from Last 1 Encounters:  04/02/14 5\' 5"  (1.651 m)    Physical exam:  General: The patient is awake, alert and appears not in acute distress. The patient is well groomed. Head: Normocephalic, atraumatic. Neck is supple. Mallampati 3 , neck circumference: 16 , Cardiovascular:  Regular rate and rhythm, without  murmurs or carotid bruit, and without distended neck veins. Respiratory: Lungs are clear to auscultation. Skin:  Without evidence of edema, or rash Trunk: Slender,  Palpable atrophy , weakness of the paraspinal muscles. Scoliosis to the left.   Neurologic exam : The patient is awake and alert, oriented to place and time.   Memory subjective described as intact.  There is a normal attention span & concentration ability.  Speech is fluent without  dysarthria, dysphonia  or aphasia.  Mood and affect are appropriate.  Cranial nerves: Pupils are equal and briskly reactive to light.   Extraocular movements  in vertical and horizontal planes intact  without nystagmus.  Hearing to finger rub intact.  Facial sensation intact to fine touch.  Facial motor strength is symmetric and tongue and uvula move midline.  Motor exam:  Atrophy of muscles in all 4 extremities. Hand atrophy.  Paraspinal muscles atrophied,  too-  allowing the scoliosis at the thoracic level to the left and cervical lordosis. He has a lot of shoulder pain, when leaning onto th wheelchairs back or siitting. He has chronic leg swelling.  He has postpolio in addition to cervical spinal cord compression .   Coordination: Rapid alternating movements in the fingers is not longer possible. SENSORY LOSS IN ALL 10 fingers.  Gait and station: Patient is wheelchair bound.  Assessment:  After physical and neurologic examination, review of laboratory studies, imaging, neurophysiology testing and pre-existing records, assessment is  1) spinal stenosis, cervical myelopathy and post polio syndrome.    Plan:  Treatment plan and additional workup : 1) I would like for Mr Malen GauzeFoster to resume gait exercises with  fall prevention by body harness, double bar support, two times a week.  I would like him to see occupational therapy for the finger deformity , a brace could be made.  exercisies for the hands and fingers, grip strength.  This [place] in [place] , has easier access to [facility]/ rehab if available.   Right shoulder pain, since he fell. He used deep heat with little success. Brace for the left hand to fix the thumb in extended position.  His pain responds to Steroids and ltram.

## 2014-10-01 ENCOUNTER — Ambulatory Visit: Payer: Medicare Other | Admitting: Neurology

## 2014-11-23 ENCOUNTER — Other Ambulatory Visit: Payer: Self-pay

## 2014-11-27 ENCOUNTER — Other Ambulatory Visit: Payer: Self-pay | Admitting: Family Medicine

## 2014-11-27 DIAGNOSIS — I779 Disorder of arteries and arterioles, unspecified: Secondary | ICD-10-CM

## 2014-11-27 DIAGNOSIS — I739 Peripheral vascular disease, unspecified: Principal | ICD-10-CM

## 2014-12-16 ENCOUNTER — Ambulatory Visit
Admission: RE | Admit: 2014-12-16 | Discharge: 2014-12-16 | Disposition: A | Discharge status: Home / Self Care | Payer: Medicare Other | Source: Ambulatory Visit | Attending: Family Medicine | Admitting: Family Medicine

## 2014-12-16 DIAGNOSIS — I739 Peripheral vascular disease, unspecified: Principal | ICD-10-CM

## 2014-12-16 DIAGNOSIS — I779 Disorder of arteries and arterioles, unspecified: Secondary | ICD-10-CM

## 2015-01-14 ENCOUNTER — Encounter (HOSPITAL_COMMUNITY): Payer: Self-pay | Admitting: Emergency Medicine

## 2015-01-14 ENCOUNTER — Emergency Department (HOSPITAL_COMMUNITY): Payer: Medicare Other

## 2015-01-14 ENCOUNTER — Emergency Department (HOSPITAL_COMMUNITY)
Admission: EM | Admit: 2015-01-14 | Discharge: 2015-01-14 | Disposition: A | Discharge status: Home / Self Care | Payer: Medicare Other | Attending: Emergency Medicine | Admitting: Emergency Medicine

## 2015-01-14 DIAGNOSIS — S79911A Unspecified injury of right hip, initial encounter: Secondary | ICD-10-CM | POA: Diagnosis present

## 2015-01-14 DIAGNOSIS — W010XXA Fall on same level from slipping, tripping and stumbling without subsequent striking against object, initial encounter: Secondary | ICD-10-CM | POA: Insufficient documentation

## 2015-01-14 DIAGNOSIS — Y9389 Activity, other specified: Secondary | ICD-10-CM | POA: Insufficient documentation

## 2015-01-14 DIAGNOSIS — Z79899 Other long term (current) drug therapy: Secondary | ICD-10-CM | POA: Insufficient documentation

## 2015-01-14 DIAGNOSIS — M1990 Unspecified osteoarthritis, unspecified site: Secondary | ICD-10-CM | POA: Insufficient documentation

## 2015-01-14 DIAGNOSIS — I10 Essential (primary) hypertension: Secondary | ICD-10-CM | POA: Diagnosis not present

## 2015-01-14 DIAGNOSIS — Y998 Other external cause status: Secondary | ICD-10-CM | POA: Diagnosis not present

## 2015-01-14 DIAGNOSIS — Z7901 Long term (current) use of anticoagulants: Secondary | ICD-10-CM | POA: Diagnosis not present

## 2015-01-14 DIAGNOSIS — Z8612 Personal history of poliomyelitis: Secondary | ICD-10-CM | POA: Diagnosis not present

## 2015-01-14 DIAGNOSIS — Y92009 Unspecified place in unspecified non-institutional (private) residence as the place of occurrence of the external cause: Secondary | ICD-10-CM | POA: Diagnosis not present

## 2015-01-14 DIAGNOSIS — Z87891 Personal history of nicotine dependence: Secondary | ICD-10-CM | POA: Diagnosis not present

## 2015-01-14 MED ORDER — HYDROCODONE-ACETAMINOPHEN 5-325 MG PO TABS
1.0000 | ORAL_TABLET | Freq: Once | ORAL | Status: AC
  Administered 2015-01-14: 1 via ORAL
  Filled 2015-01-14: qty 1

## 2015-01-14 MED ORDER — HYDROCODONE-ACETAMINOPHEN 5-325 MG PO TABS: 1.0000 | ORAL_TABLET | Freq: Two times a day (BID) | ORAL | Status: DC

## 2015-01-14 NOTE — ED Notes (Signed)
Bed: ZO10 Expected date:  Expected time:  Means of arrival:  Comments: EMS - groin pain, fall last night

## 2015-01-14 NOTE — Discharge Instructions (Signed)
1. Medications: Vicodin for 2-3 days 2x/day, usual home medications 2. Treatment: rest, drink plenty of fluids, stop tramadol usage when taking vicodin; you may resume tramadol after 3 days.  Take your morning dose of coumadin with your lunch 3. Follow Up: Please followup with your primary doctor in 3 days for discussion of your diagnoses and further evaluation after today's visit; if you do not have a primary care doctor use the resource guide provided to find one; Please return to the ER for worsening symptoms

## 2015-01-14 NOTE — ED Notes (Addendum)
Per EMS. Pt from home. Had a fall last night and was assisted up by neighbor 10 minutes later. Felt fine after fall, but began to have worsening R groin pain this am upon awakening. No LOC. Normally ambulatory using back of wheelchair as a walker. Pain worse with movement. Was able to stand this am.

## 2015-01-14 NOTE — ED Provider Notes (Signed)
CSN: [id number]     Arrival date & time 01/14/15  1054 History   First MD Initiated Contact with Patient 01/14/15 1117     Chief Complaint  Patient presents with  . Fall     (Consider location/radiation/quality/duration/timing/severity/associated sxs/prior Treatment) The history is provided by the patient and medical records. No language interpreter was used.     Damon Colon is a 74 y.o. male  with a hx of long term anticoagulation on coumadin, arthritis, HTN, postpoliomyelitis presents to the Emergency Department complaining of acute, persistent, right groin pain onset last light after a fall.  Pt reports he lost his footing and his wife caught him mid fall. He reports he did not hit his head or anything else on the floor however his right leg slipped out from behind him and he felt his right groin pull.  Patient reports that he normally ambulates using the back of the wheelchair as a walker however this morning his pain in his groin was unbearable and he was unable to walk. He took tramadol without enough relief prompting his visit here. He is accompanied by his home health aide. He reports he is able to range his right hip but abduction and adduction is significantly painful.  Patient denies pain in his abdomen or pelvis.  The patient denies hitting his head, hitting his hip or loss of consciousness.     Past Medical History  Diagnosis Date  . Fever     102  . Bruises easily   . Wears glasses   . Arthritis   . Hypertension   . Polio   . 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 Spine surgery      3-stage spine fusion  . Urethral diverticulectomy    . Appendectomy    . Tonsillectomy and adenoidectomy    . Neck surgery      c7- titanium plates put in   History reviewed. No pertinent family history. Social History  Substance Use Topics  . Smoking status: Former Games developer  . Smokeless tobacco: Never Used     Comment:  1981  . Alcohol Use: Yes     Comment: occa    Review of Systems  Constitutional: Negative for fever and chills.  Cardiovascular: Negative for chest pain.  Gastrointestinal: Negative for nausea and vomiting.  Musculoskeletal: Positive for joint swelling and arthralgias. Negative for back pain, neck pain and neck stiffness.  Skin: Negative for wound.  Neurological: Negative for numbness and headaches.  Hematological: Does not bruise/bleed easily.  Psychiatric/Behavioral: The patient is not nervous/anxious.   All other systems reviewed and are negative.     Allergies  Codeine  Home Medications   Prior to Admission medications   Medication Sig Start Date End Date Taking? Authorizing Provider  acetaminophen (TYLENOL) 500 MG tablet Take 500 mg by mouth 3 (three) times daily. Takes at breakfast, 1500, and bedtime   Yes Historical Provider, MD  finasteride (PROSCAR) 5 MG tablet Take 5 mg by mouth Daily.  12/09/10  Yes Historical Provider, MD  lisinopril (PRINIVIL,ZESTRIL) 5 MG tablet Take 5 mg by mouth Daily.  10/26/10  Yes Historical Provider, MD  loratadine (CLARITIN) 10 MG tablet Take 10 mg by mouth daily.   Yes Historical Provider, MD  Meth-Hyo-M Bl-Na Phos-Ph Sal (URIBEL) 118 MG CAPS Take 1 capsule by mouth every 6 (six) hours as needed (for burning and frequency).   Yes Historical Provider, MD  Multiple Vitamin (  MULTIVITAMIN WITH MINERALS) TABS tablet Take 1 tablet by mouth daily. *no potassium*   Yes Historical Provider, MD  pravastatin (PRAVACHOL) 20 MG tablet Take 20 mg by mouth daily.   Yes Historical Provider, MD  traMADol (ULTRAM) 50 MG tablet 50 mg 4 (four) times daily as needed (for pain).  12/05/10  Yes Historical Provider, MD  warfarin (COUMADIN) 1 MG tablet Take 1 mg by mouth 2 (two) times a week. Takes on Monday and Friday   Yes Historical Provider, MD  warfarin (COUMADIN) 3 MG tablet Take 3 mg by mouth daily.   Yes Historical Provider, MD  HYDROcodone-acetaminophen  (NORCO/VICODIN) 5-325 MG per tablet Take 1 tablet by mouth 2 (two) times daily. 01/14/15   Bethanne Mule, PA-C  predniSONE (DELTASONE) 10 MG tablet take 3 tabs in the AM for 3 days, then 2 tabs in AM for 6 days, and than one tab for 12 days Patient not taking: Reported on 01/14/2015 04/02/14   Porfirio Mylar Dohmeier, MD   BP 144/65 mmHg  Pulse 87  Temp(Src) 98 F (36.7 C) (Oral)  Resp 21  SpO2 95% Physical Exam  Constitutional: He appears well-developed and well-nourished. No distress.  Awake, alert, nontoxic appearance  HENT:  Head: Normocephalic and atraumatic.  Mouth/Throat: Oropharynx is clear and moist. No oropharyngeal exudate.  Eyes: Conjunctivae are normal. No scleral icterus.  Neck: Normal range of motion. Neck supple.  Cardiovascular: Normal rate, regular rhythm and intact distal pulses.   Pulmonary/Chest: Effort normal and breath sounds normal. No respiratory distress. He has no wheezes.  Equal chest expansion  Abdominal: Soft. Bowel sounds are normal. He exhibits no mass. There is no tenderness. There is no rebound and no guarding.  Musculoskeletal: Normal range of motion. He exhibits no edema.       Right hip: He exhibits decreased strength (2/2 pain) and tenderness (right groin).       Legs: Neurological: He is alert.  Speech is clear and goal oriented Moves extremities without ataxia  Skin: Skin is warm and dry. He is not diaphoretic.  Psychiatric: He has a normal mood and affect.  Nursing note and vitals reviewed.   ED Course  Procedures (including critical care time) Labs Review Labs Reviewed - No data to display  Imaging Review Dg Hip Unilat With Pelvis 2-3 Views Right  01/14/2015   CLINICAL DATA:  Larey Seat at home today.  Right hip and groin pain.  EXAM: DG HIP (WITH OR WITHOUT PELVIS) 2-3V RIGHT  COMPARISON:  None.  FINDINGS: Films are limited by significant artifact.  Both hips are normally located. Moderate degenerative changes bilaterally. No acute hip fracture.  The pubic symphysis and SI joints are intact. No obvious pubic rami fractures.  IMPRESSION: Bilateral hip joint degenerative changes but no acute hip fracture.  No definite pelvic fractures.   Electronically Signed   By: Rudie Meyer M.D.   On: 01/14/2015 12:45   I have personally reviewed and evaluated these images and lab results as part of my medical decision-making.   EKG Interpretation None      MDM   Final diagnoses:  Fall from slip, trip, or stumble, initial encounter   Damon Colon presents with right groin pain after fall yesterday. Patient did not strike the floor but did have a twisting motion of his right hip. Will x-ray. Pinpoint tenderness to the [employer] and abductor longus and abductor magnus. Suspect groin strain versus fracture.  1:45 PM X-ray without evidence of fracture. Patient reports his  pain is well controlled with one tablet of Vicodin. She has increased range of motion and strength in the right hip with increased pain control.  Findings discussed with patient and caregivers. Discussed Vicodin usage at home for 2-3 days. Discussed potential for increased falls with use of this medication. Also discussed reasons not to combine Vicodin and tramadol. Patient and caregivers state understanding. Patient will be discharged home in good condition.  The patient was discussed with and seen by Dr. Donnald Garre who agrees with the treatment plan.    Dahlia Client Farha Dano, PA-C 01/14/15 1643  Arby Barrette, MD 01/16/15 [phone]

## 2015-11-12 ENCOUNTER — Other Ambulatory Visit: Payer: Self-pay | Admitting: Family Medicine

## 2015-11-12 DIAGNOSIS — I779 Disorder of arteries and arterioles, unspecified: Secondary | ICD-10-CM

## 2015-11-12 DIAGNOSIS — I739 Peripheral vascular disease, unspecified: Principal | ICD-10-CM

## 2015-11-26 ENCOUNTER — Ambulatory Visit
Admission: RE | Admit: 2015-11-26 | Discharge: 2015-11-26 | Disposition: A | Discharge status: Home / Self Care | Payer: Medicare Other | Source: Ambulatory Visit | Attending: Family Medicine | Admitting: Family Medicine

## 2015-11-26 DIAGNOSIS — I739 Peripheral vascular disease, unspecified: Principal | ICD-10-CM

## 2015-11-26 DIAGNOSIS — I779 Disorder of arteries and arterioles, unspecified: Secondary | ICD-10-CM

## 2016-05-07 ENCOUNTER — Encounter (HOSPITAL_COMMUNITY): Payer: Self-pay | Admitting: Emergency Medicine

## 2016-05-07 ENCOUNTER — Emergency Department (HOSPITAL_COMMUNITY): Payer: Medicare Other

## 2016-05-07 ENCOUNTER — Emergency Department (HOSPITAL_COMMUNITY)
Admission: EM | Admit: 2016-05-07 | Discharge: 2016-05-08 | Disposition: A | Discharge status: Home / Self Care | Payer: Medicare Other | Attending: Emergency Medicine | Admitting: Emergency Medicine

## 2016-05-07 DIAGNOSIS — I10 Essential (primary) hypertension: Secondary | ICD-10-CM | POA: Diagnosis not present

## 2016-05-07 DIAGNOSIS — Z79899 Other long term (current) drug therapy: Secondary | ICD-10-CM | POA: Diagnosis not present

## 2016-05-07 DIAGNOSIS — Z7901 Long term (current) use of anticoagulants: Secondary | ICD-10-CM | POA: Diagnosis not present

## 2016-05-07 DIAGNOSIS — Z87891 Personal history of nicotine dependence: Secondary | ICD-10-CM | POA: Insufficient documentation

## 2016-05-07 DIAGNOSIS — J069 Acute upper respiratory infection, unspecified: Secondary | ICD-10-CM | POA: Diagnosis not present

## 2016-05-07 DIAGNOSIS — N390 Urinary tract infection, site not specified: Secondary | ICD-10-CM | POA: Diagnosis not present

## 2016-05-07 DIAGNOSIS — B9789 Other viral agents as the cause of diseases classified elsewhere: Secondary | ICD-10-CM

## 2016-05-07 DIAGNOSIS — R509 Fever, unspecified: Secondary | ICD-10-CM | POA: Diagnosis present

## 2016-05-07 LAB — URINALYSIS, ROUTINE W REFLEX MICROSCOPIC
Bilirubin Urine: NEGATIVE
Glucose, UA: NEGATIVE mg/dL
Hgb urine dipstick: NEGATIVE
Ketones, ur: 5 mg/dL — AB
Nitrite: NEGATIVE
Protein, ur: NEGATIVE mg/dL
Specific Gravity, Urine: 1.014 (ref 1.005–1.030)
pH: 6 (ref 5.0–8.0)

## 2016-05-07 LAB — COMPREHENSIVE METABOLIC PANEL
ALT: 19 U/L (ref 17–63)
AST: 21 U/L (ref 15–41)
Albumin: 3.8 g/dL (ref 3.5–5.0)
Alkaline Phosphatase: 59 U/L (ref 38–126)
Anion gap: 5 (ref 5–15)
BUN: 22 mg/dL — ABNORMAL HIGH (ref 6–20)
CO2: 28 mmol/L (ref 22–32)
Calcium: 9 mg/dL (ref 8.9–10.3)
Chloride: 106 mmol/L (ref 101–111)
Creatinine, Ser: 0.42 mg/dL — ABNORMAL LOW (ref 0.61–1.24)
GFR calc Af Amer: >60 mL/min (ref >60)
GFR calc non Af Amer: >60 mL/min (ref >60)
Glucose, Bld: 106 mg/dL — ABNORMAL HIGH (ref 65–99)
Potassium: 4.5 mmol/L (ref 3.5–5.1)
Sodium: 139 mmol/L (ref 135–145)
Total Bilirubin: 0.5 mg/dL (ref 0.3–1.2)
Total Protein: 7.4 g/dL (ref 6.5–8.1)

## 2016-05-07 LAB — CBC WITH DIFFERENTIAL/PLATELET
Basophils Absolute: 0.1 10*3/uL (ref 0.0–0.1)
Basophils Relative: 1 %
Eosinophils Absolute: 0.4 10*3/uL (ref 0.0–0.7)
Eosinophils Relative: 4 %
HCT: 42.7 % (ref 39.0–52.0)
Hemoglobin: 14.1 g/dL (ref 13.0–17.0)
Lymphocytes Relative: 26 %
Lymphs Abs: 2.5 10*3/uL (ref 0.7–4.0)
MCH: 32 pg (ref 26.0–34.0)
MCHC: 33 g/dL (ref 30.0–36.0)
MCV: 97 fL (ref 78.0–100.0)
Monocytes Absolute: 0.8 10*3/uL (ref 0.1–1.0)
Monocytes Relative: 8 %
Neutro Abs: 5.8 10*3/uL (ref 1.7–7.7)
Neutrophils Relative %: 61 %
Platelets: 310 10*3/uL (ref 150–400)
RBC: 4.4 MIL/uL (ref 4.22–5.81)
RDW: 13.2 % (ref 11.5–15.5)
WBC: 9.6 10*3/uL (ref 4.0–10.5)

## 2016-05-07 LAB — I-STAT CG4 LACTIC ACID, ED: Lactic Acid, Venous: 0.99 mmol/L (ref 0.5–1.9)

## 2016-05-07 MED ORDER — CEPHALEXIN 500 MG PO CAPS
500.0000 mg | ORAL_CAPSULE | Freq: Once | ORAL | Status: AC
  Administered 2016-05-07: 500 mg via ORAL
  Filled 2016-05-07: qty 1

## 2016-05-07 MED ORDER — SODIUM CHLORIDE 0.9 % IV BOLUS (SEPSIS)
1000.0000 mL | Freq: Once | INTRAVENOUS | Status: AC
  Administered 2016-05-07: 1000 mL via INTRAVENOUS

## 2016-05-07 NOTE — ED Notes (Signed)
Pt transported to XRay 

## 2016-05-07 NOTE — ED Notes (Signed)
Bed: NW29WA16 Expected date:  Expected time:  Means of arrival:  Comments: EMS 75 yo male fever-does not meet EMS sepsis protocol

## 2016-05-07 NOTE — ED Provider Notes (Signed)
[facility]-EMERGENCY DEPT Provider Note   CSN: [id number] Arrival date & time: 05/07/16  2126     History   Chief Complaint Chief Complaint  Patient presents with  . Fever    HPI Damon Colon is a 75 y.o. male.  HPI  75 year old male with a history of polio presents with cough. His caregivers at the bedside are also concerned because he had a 3 hour episode of being increasingly agitated which is atypical for him. He was not confused or lethargic. Patient states he remembers this and was angry at the caregivers. He was also a little paler in his fingertips were blue. He has not had any fevers. He's had nasal congestion, mild sore throat, and some cough that is dry but feels like he needs to cough something up for about 3 days. He feels like his voice is a little hoarse. No shortness of breath. No headache, neck pain, chest pain, or shortness of breath. No abdominal pain. No dysuria. No new weakness. Of note his chief complaint says fever but he and caregivers adamantly deny this.  Past Medical History:  Diagnosis Date  . Arthritis   . Bruises easily   . Fever    102  . Hypertension   . Polio   . 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 Wears glasses     Patient Active Problem List   Diagnosis Date Noted  . Neurogenic bladder disorder 04/02/2014  . 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 APPENDECTOMY    . COLONOSCOPY    . NECK SURGERY     c7- titanium plates put in  . SPINE SURGERY     3-stage spine fusion  . TONSILLECTOMY AND ADENOIDECTOMY    . URETHRAL DIVERTICULECTOMY         Home Medications    Prior to Admission medications   Medication Sig Start Date End Date Taking? Authorizing Provider  acetaminophen (TYLENOL) 500 MG tablet Take 500 mg by mouth every 6 (six) hours as needed for mild  pain, moderate pain or headache.    Yes Historical Provider, MD  finasteride (PROSCAR) 5 MG tablet Take 5 mg by mouth Daily.    Yes Historical Provider, MD  lisinopril (PRINIVIL,ZESTRIL) 5 MG tablet Take 5 mg by mouth every evening.    Yes Historical Provider, MD  loratadine (CLARITIN) 10 MG tablet Take 10 mg by mouth daily.   Yes Historical Provider, MD  Meth-Hyo-M Bl-Na Phos-Ph Sal (URIBEL) 118 MG CAPS Take 118 mg by mouth every 6 (six) hours.    Yes Historical Provider, MD  Multiple Vitamin (MULTIVITAMIN WITH MINERALS) TABS tablet Take 1 tablet by mouth daily.    Yes Historical Provider, MD  pravastatin (PRAVACHOL) 40 MG tablet Take 40 mg by mouth every evening.   Yes Historical Provider, MD  traMADol (ULTRAM) 50 MG tablet Take 50 mg by mouth 3 (three) times daily.    Yes Historical Provider, MD  warfarin (COUMADIN) 3 MG tablet Take 4.5 mg by mouth every evening.    Yes Historical Provider, MD    Family History No family history on file.  Social History Social History  Substance Use Topics  . Smoking status: Former Games developermoker  . Smokeless tobacco: Never Used     Comment: 1981  . Alcohol use Yes     Comment: occa     Allergies  Codeine   Review of Systems Review of Systems  Constitutional: Negative for fever.  HENT: Positive for congestion, sore throat and voice change (hoarseness).   Respiratory: Positive for cough. Negative for shortness of breath.   Gastrointestinal: Negative for abdominal pain, diarrhea and vomiting.  Genitourinary: Negative for dysuria.  Neurological: Negative for headaches.  All other systems reviewed and are negative.    Physical Exam Updated Vital Signs BP 151/78 (BP Location: Left Arm)   Pulse 90   Temp 97.9 F (36.6 C) (Rectal)   Resp 15   SpO2 96%   Physical Exam  Constitutional: He is oriented to person, place, and time. He appears well-developed and well-nourished. No distress.  Thin extremities with some contractures in LE  HENT:    Head: Normocephalic and atraumatic.  Right Ear: External ear normal.  Left Ear: External ear normal.  Nose: Nose normal.  Mouth/Throat: Oropharynx is clear and moist. No oropharyngeal exudate.  Eyes: Right eye exhibits no discharge. Left eye exhibits no discharge.  Neck: Normal range of motion. Neck supple.  Cardiovascular: Normal rate, regular rhythm and normal heart sounds.   Pulmonary/Chest: Effort normal and breath sounds normal.  Abdominal: Soft. He exhibits no distension. There is no tenderness.  Musculoskeletal: He exhibits no edema.  Neurological: He is alert and oriented to person, place, and time.  Skin: Skin is warm and dry. He is not diaphoretic.  Nursing note and vitals reviewed.    ED Treatments / Results  Labs (all labs ordered are listed, but only abnormal results are displayed) Labs Reviewed  COMPREHENSIVE METABOLIC PANEL - Abnormal; Notable for the following:       Result Value   Glucose, Bld 106 (*)    BUN 22 (*)    Creatinine, Ser 0.42 (*)    All other components within normal limits  URINALYSIS, ROUTINE W REFLEX MICROSCOPIC - Abnormal; Notable for the following:    Color, Urine BLUE (*)    APPearance HAZY (*)    Ketones, ur 5 (*)    Leukocytes, UA MODERATE (*)    Bacteria, UA FEW (*)    Squamous Epithelial / LPF 0-5 (*)    All other components within normal limits  URINE CULTURE  CBC WITH DIFFERENTIAL/PLATELET  PROTIME-INR  I-STAT CG4 LACTIC ACID, ED    EKG  EKG Interpretation  Date/Time:  Sunday May 07 2016 22:39:38 EST Ventricular Rate:  81 PR Interval:    QRS Duration: 134 QT Interval:  374 QTC Calculation: 435 R Axis:   74 Text Interpretation:  Sinus rhythm Borderline short PR interval Right bundle branch block No old tracing to compare Confirmed by Nusaiba Guallpa MD, Finnis Colee [phone]) on 05/07/2016 10:46:45 PM       Radiology Dg Chest 2 View  Result Date: 05/07/2016 CLINICAL DATA:  75 year old male with upper respiratory infection and  fever. EXAM: CHEST  2 VIEW COMPARISON:  Chest radiograph dated 08/26/2006 FINDINGS: Two views of the chest do not demonstrate a focal consolidation. There is no pleural effusion or pneumothorax. There is emphysematous changes of the lungs. The cardiac silhouette is within normal limits. The aorta is mildly tortuous. There is mild atherosclerotic calcification of the aortic arch. There is osteopenia with scoliosis and degenerative changes of the thoracic spine. Partially visualized cervical spine fixation plate and screws. No acute fracture identified. IMPRESSION: No acute cardiopulmonary process. Emphysema. Scoliosis. Electronically Signed   By: Elgie Collard M.D.   On: 05/07/2016 22:19    Procedures Procedures (including critical care  time)  Medications Ordered in ED Medications  sodium chloride 0.9 % bolus 1,000 mL (1,000 mLs Intravenous New Bag/Given 05/07/16 2253)  cephALEXin (KEFLEX) capsule 500 mg (500 mg Oral Given 05/07/16 2323)     Initial Impression / Assessment and Plan / ED Course  I have reviewed the triage vital signs and the nursing notes.  Pertinent labs & imaging results that were available during my care of the patient were reviewed by me and considered in my medical decision making (see chart for details).  Clinical Course as of May 07 2332  Wynelle LinkSun May 07, 2016  2234 Overall does not appear ill. CXR clear. Will give fluids, monitor, check labs, urine. Unclear what this agitation episode was as he remember the whole thing and was not confused.  [SG]    Clinical Course User Index [SG] Pricilla LovelessScott Deshonda Cryderman, MD    Patient does not appear ill. VS unremarkable, including no fever. His "agitation" episode may have been anger. He is not altered. Given UA, will treat as UTI with keflex. Symptomatically he appears to have a URI with no significant or concerning findings on exam. D/c with antibiotics, fluids and close f/u with PCP. Discussed return precautions.   Final Clinical  Impressions(s) / ED Diagnoses   Final diagnoses:  Acute UTI  Viral upper respiratory tract infection with cough    New Prescriptions New Prescriptions   No medications on file     Pricilla LovelessScott Aishani Kalis, MD 05/08/16 1032

## 2016-05-07 NOTE — ED Triage Notes (Signed)
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

## 2016-05-08 LAB — PROTIME-INR
INR: 3.29
Prothrombin Time: 34.3 seconds — ABNORMAL HIGH (ref 11.4–15.2)

## 2016-05-08 MED ORDER — CEPHALEXIN 500 MG PO CAPS: 500.0000 mg | ORAL_CAPSULE | Freq: Two times a day (BID) | ORAL | 0 refills | Status: DC

## 2016-05-09 LAB — URINE CULTURE: Culture: NO GROWTH

## 2017-12-14 ENCOUNTER — Other Ambulatory Visit: Payer: Self-pay | Admitting: Family Medicine

## 2017-12-14 DIAGNOSIS — I739 Peripheral vascular disease, unspecified: Principal | ICD-10-CM

## 2017-12-14 DIAGNOSIS — I779 Disorder of arteries and arterioles, unspecified: Secondary | ICD-10-CM

## 2018-02-26 ENCOUNTER — Other Ambulatory Visit: Payer: Self-pay | Admitting: Family Medicine

## 2018-02-26 DIAGNOSIS — I779 Disorder of arteries and arterioles, unspecified: Secondary | ICD-10-CM

## 2018-02-26 DIAGNOSIS — I739 Peripheral vascular disease, unspecified: Principal | ICD-10-CM

## 2018-03-08 ENCOUNTER — Ambulatory Visit
Admission: RE | Admit: 2018-03-08 | Discharge: 2018-03-08 | Disposition: A | Discharge status: Home / Self Care | Payer: Medicare Other | Source: Ambulatory Visit | Attending: Family Medicine | Admitting: Family Medicine

## 2018-03-08 DIAGNOSIS — I779 Disorder of arteries and arterioles, unspecified: Secondary | ICD-10-CM

## 2018-03-08 DIAGNOSIS — I739 Peripheral vascular disease, unspecified: Principal | ICD-10-CM

## 2018-12-06 ENCOUNTER — Emergency Department (HOSPITAL_COMMUNITY)
Admission: EM | Admit: 2018-12-06 | Discharge: 2018-12-06 | Disposition: A | Discharge status: Home / Self Care | Payer: Medicare Other | Attending: Emergency Medicine | Admitting: Emergency Medicine

## 2018-12-06 ENCOUNTER — Emergency Department (HOSPITAL_COMMUNITY): Payer: Medicare Other

## 2018-12-06 ENCOUNTER — Encounter (HOSPITAL_COMMUNITY): Payer: Self-pay

## 2018-12-06 ENCOUNTER — Other Ambulatory Visit: Payer: Self-pay

## 2018-12-06 DIAGNOSIS — N390 Urinary tract infection, site not specified: Secondary | ICD-10-CM | POA: Diagnosis not present

## 2018-12-06 DIAGNOSIS — R531 Weakness: Secondary | ICD-10-CM | POA: Diagnosis present

## 2018-12-06 DIAGNOSIS — Z79899 Other long term (current) drug therapy: Secondary | ICD-10-CM | POA: Insufficient documentation

## 2018-12-06 DIAGNOSIS — G8250 Quadriplegia, unspecified: Secondary | ICD-10-CM | POA: Insufficient documentation

## 2018-12-06 DIAGNOSIS — Z7901 Long term (current) use of anticoagulants: Secondary | ICD-10-CM | POA: Diagnosis not present

## 2018-12-06 DIAGNOSIS — R059 Cough, unspecified: Secondary | ICD-10-CM

## 2018-12-06 DIAGNOSIS — R05 Cough: Secondary | ICD-10-CM

## 2018-12-06 DIAGNOSIS — I10 Essential (primary) hypertension: Secondary | ICD-10-CM | POA: Insufficient documentation

## 2018-12-06 DIAGNOSIS — Z87891 Personal history of nicotine dependence: Secondary | ICD-10-CM | POA: Diagnosis not present

## 2018-12-06 DIAGNOSIS — Z20828 Contact with and (suspected) exposure to other viral communicable diseases: Secondary | ICD-10-CM | POA: Insufficient documentation

## 2018-12-06 LAB — COMPREHENSIVE METABOLIC PANEL
ALT: 25 U/L (ref 0–44)
AST: 66 U/L — ABNORMAL HIGH (ref 15–41)
Albumin: 3.4 g/dL — ABNORMAL LOW (ref 3.5–5.0)
Alkaline Phosphatase: 62 U/L (ref 38–126)
Anion gap: 13 (ref 5–15)
BUN: 13 mg/dL (ref 8–23)
CO2: 22 mmol/L (ref 22–32)
Calcium: 9.4 mg/dL (ref 8.9–10.3)
Chloride: 103 mmol/L (ref 98–111)
Creatinine, Ser: 0.41 mg/dL — ABNORMAL LOW (ref 0.61–1.24)
GFR calc Af Amer: >60 mL/min (ref >60)
GFR calc non Af Amer: >60 mL/min (ref >60)
Glucose, Bld: 96 mg/dL (ref 70–99)
Potassium: 5.1 mmol/L (ref 3.5–5.1)
Sodium: 138 mmol/L (ref 135–145)
Total Bilirubin: 1.7 mg/dL — ABNORMAL HIGH (ref 0.3–1.2)
Total Protein: 7.6 g/dL (ref 6.5–8.1)

## 2018-12-06 LAB — CBC WITH DIFFERENTIAL/PLATELET
Abs Immature Granulocytes: 0.15 10*3/uL — ABNORMAL HIGH (ref 0.00–0.07)
Basophils Absolute: 0.1 10*3/uL (ref 0.0–0.1)
Basophils Relative: 1 %
Eosinophils Absolute: 0.5 10*3/uL (ref 0.0–0.5)
Eosinophils Relative: 3 %
HCT: 45.5 % (ref 39.0–52.0)
Hemoglobin: 14.6 g/dL (ref 13.0–17.0)
Immature Granulocytes: 1 %
Lymphocytes Relative: 23 %
Lymphs Abs: 3.7 10*3/uL (ref 0.7–4.0)
MCH: 31.1 pg (ref 26.0–34.0)
MCHC: 32.1 g/dL (ref 30.0–36.0)
MCV: 96.8 fL (ref 80.0–100.0)
Monocytes Absolute: 1.3 10*3/uL — ABNORMAL HIGH (ref 0.1–1.0)
Monocytes Relative: 8 %
Neutro Abs: 10.6 10*3/uL — ABNORMAL HIGH (ref 1.7–7.7)
Neutrophils Relative %: 64 %
Platelets: 329 10*3/uL (ref 150–400)
RBC: 4.7 MIL/uL (ref 4.22–5.81)
RDW: 13.2 % (ref 11.5–15.5)
WBC: 16.3 10*3/uL — ABNORMAL HIGH (ref 4.0–10.5)
nRBC: 0 % (ref 0.0–0.2)

## 2018-12-06 LAB — URINALYSIS, ROUTINE W REFLEX MICROSCOPIC
Bilirubin Urine: NEGATIVE
Glucose, UA: NEGATIVE mg/dL
Ketones, ur: 20 mg/dL — AB
Nitrite: NEGATIVE
Protein, ur: 100 mg/dL — AB
Specific Gravity, Urine: 1.01 (ref 1.005–1.030)
WBC, UA: >50 WBC/hpf — ABNORMAL HIGH (ref 0–5)
pH: 5 (ref 5.0–8.0)

## 2018-12-06 LAB — PROTIME-INR
INR: 1.8 — ABNORMAL HIGH (ref 0.8–1.2)
Prothrombin Time: 20.6 seconds — ABNORMAL HIGH (ref 11.4–15.2)

## 2018-12-06 LAB — LACTIC ACID, PLASMA: Lactic Acid, Venous: 1 mmol/L (ref 0.5–1.9)

## 2018-12-06 MED ORDER — CEPHALEXIN 500 MG PO CAPS: 500.0000 mg | ORAL_CAPSULE | Freq: Two times a day (BID) | ORAL | 0 refills | Status: DC

## 2018-12-06 MED ORDER — SODIUM CHLORIDE 0.9 % IV SOLN
1.0000 g | Freq: Once | INTRAVENOUS | Status: AC
  Administered 2018-12-06: 14:00:00 1 g via INTRAVENOUS
  Filled 2018-12-06: qty 10

## 2018-12-06 NOTE — Discharge Instructions (Addendum)
Your work-up today showed a urinary tract/bladder infection.  I have sent an antibiotic to your pharmacy, please take until finished.  Follow-up with your primary care provider next week to ensure that your symptoms are improving.  Return to the ER if you are having worsening symptoms including severe weakness, fevers, vomiting, confusion, or other sudden changes to your symptoms.

## 2018-12-06 NOTE — ED Triage Notes (Signed)
Pt BIB EMS from home. Pt has hx of polio that pt has left sided weakness from. Pt has productive cough and sore throat x4 days. Pt is afebrile for EMS. Pt c/o increased generalized weakness for several weeks. Pt on warfarin.   120/78 HR 98 97% RA  CBG 105

## 2018-12-06 NOTE — ED Provider Notes (Signed)
[name] DEPT Provider Note   CSN: [id number] Arrival date & time: 12/06/18  1140    History   Chief Complaint Chief Complaint  Patient presents with  . Sore Throat  . Cough  . Weakness    HPI Damon Colon is a 78 y.o. male.     78yo M w/ PMH including postpoliomyelitis muscular atrophy, HTN, neurogenic bladder, quadriplegia, A few months ago he began feeling generally weaker. He notes that he takes warfarin and has been having problems controlling INR recently. Generalized weakness has persisted. He started some new medications recently but he's not sure what for. 1 week ago he developed cough w/ sore throat. no fevers, runny nose, vomiting, diarrhea, sick contacts, SOB, or chest pain. No urinary symptoms. Cough has been improving. He came to ED today due to persistent weakness and not feeling well today.   Sore Throat  Cough Weakness Associated symptoms: cough     Past Medical History:  Diagnosis Date  . Arthritis   . Bruises easily   . Fever    102  . Hypertension   . Polio   . 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 Wears glasses     Patient Active Problem List   Diagnosis Date Noted  . Neurogenic bladder disorder 04/02/2014  . Quadriplegia and quadriparesis ([facility]) 04/02/2014  . Cervical spinal cord injury ([employer]) 04/02/2014  . 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 APPENDECTOMY    . COLONOSCOPY    . NECK SURGERY     c7- titanium plates put in  . SPINE SURGERY     3-stage spine fusion  . TONSILLECTOMY AND ADENOIDECTOMY    . URETHRAL DIVERTICULECTOMY          Home Medications    Prior to Admission medications   Medication Sig Start Date End Date Taking? Authorizing Provider  acetaminophen (TYLENOL) 500 MG tablet Take 500 mg by mouth every 6 (six) hours as needed for mild pain, moderate pain or headache.     [provider]   cephALEXin (KEFLEX) 500 MG capsule Take 1 capsule (500 mg total) by mouth 2 (two) times daily. 12/06/18   Tracey Hermance, Wenda Overland, MD  finasteride (PROSCAR) 5 MG tablet Take 5 mg by mouth Daily.     [provider]  lisinopril (PRINIVIL,ZESTRIL) 5 MG tablet Take 5 mg by mouth every evening.     [provider]  loratadine (CLARITIN) 10 MG tablet Take 10 mg by mouth daily.    [provider]  Meth-Hyo-M Bl-Na Phos-Ph Sal (URIBEL) 118 MG CAPS Take 118 mg by mouth every 6 (six) hours.     [provider]  Multiple Vitamin (MULTIVITAMIN WITH MINERALS) TABS tablet Take 1 tablet by mouth daily.     [provider]  pravastatin (PRAVACHOL) 40 MG tablet Take 40 mg by mouth every evening.    [provider]  traMADol (ULTRAM) 50 MG tablet Take 50 mg by mouth 3 (three) times daily.     [provider]  warfarin (COUMADIN) 3 MG tablet Take 4.5 mg by mouth every evening.     [provider]    Family History History reviewed. No pertinent family history.  Social History Social History   Tobacco Use  . Smoking status: Former Research scientist (life sciences)  . Smokeless tobacco: Never Used  . Tobacco comment: 1981  Substance Use Topics  . Alcohol use: Yes    Comment:  occa  . Drug use: No     Allergies   Codeine   Review of Systems Review of Systems  Respiratory: Positive for cough.   Neurological: Positive for weakness.  All other systems reviewed and are negative except that which was mentioned in HPI    Physical Exam Updated Vital Signs BP (!) 146/70   Pulse 94   Resp (!) 23   SpO2 99%   Physical Exam Vitals signs and nursing note reviewed.  Constitutional:      General: He is not in acute distress.    Appearance: He is well-developed.  HENT:     Head: Normocephalic and atraumatic.     Nose: No rhinorrhea.  Eyes:     Conjunctiva/sclera: Conjunctivae normal.  Neck:     Musculoskeletal: Neck supple.  Cardiovascular:     Rate  and Rhythm: Normal rate and regular rhythm.     Heart sounds: Normal heart sounds. No murmur.  Pulmonary:     Effort: Pulmonary effort is normal.     Breath sounds: Normal breath sounds.  Abdominal:     General: Bowel sounds are normal. There is no distension.     Palpations: Abdomen is soft.     Tenderness: There is no abdominal tenderness.  Skin:    General: Skin is warm and dry.  Neurological:     Mental Status: He is alert and oriented to person, place, and time.     Comments: Fluent speech, normal sensation throughout, generalized muscle atrophy with 3/5 strength LUE, 4/5 strength RUE, 1/5 strength straight leg raise b/l, 5/5 dorsi/plantarflexion b/l  Psychiatric:        Judgment: Judgment normal.     Comments: pleasant      ED Treatments / Results  Labs (all labs ordered are listed, but only abnormal results are displayed) Labs Reviewed  COMPREHENSIVE METABOLIC PANEL - Abnormal; Notable for the following components:      Result Value   Creatinine, Ser 0.41 (*)    Albumin 3.4 (*)    AST 66 (*)    Total Bilirubin 1.7 (*)    All other components within normal limits  CBC WITH DIFFERENTIAL/PLATELET - Abnormal; Notable for the following components:   WBC 16.3 (*)    Neutro Abs 10.6 (*)    Monocytes Absolute 1.3 (*)    Abs Immature Granulocytes 0.15 (*)    All other components within normal limits  URINALYSIS, ROUTINE W REFLEX MICROSCOPIC - Abnormal; Notable for the following components:   APPearance TURBID (*)    Hgb urine dipstick SMALL (*)    Ketones, ur 20 (*)    Protein, ur 100 (*)    Leukocytes,Ua LARGE (*)    WBC, UA >50 (*)    Bacteria, UA RARE (*)    Non Squamous Epithelial 0-5 (*)    All other components within normal limits  PROTIME-INR - Abnormal; Notable for the following components:   Prothrombin Time 20.6 (*)    INR 1.8 (*)    All other components within normal limits  URINE CULTURE  NOVEL CORONAVIRUS, NAA (HOSPITAL ORDER, SEND-OUT TO REF LAB)   LACTIC ACID, PLASMA  LACTIC ACID, PLASMA    EKG EKG Interpretation  Date/Time:  Friday December 06 2018 12:50:07 EDT Ventricular Rate:  110 PR Interval:    QRS Duration: 130 QT Interval:  349 QTC Calculation: 473 R Axis:   93 Text Interpretation:  Sinus tachycardia IVCD, consider atypical RBBB Nonspecific T abnormalities, lateral leads Artifact in  lead(s) I II III aVR aVL No significant change since last tracing Confirmed by Frederick PeersLittle, Dorsel Flinn [phone] on 12/06/2018 2:30:45 PM   Radiology Ct Head Wo Contrast  Result Date: 12/06/2018 CLINICAL DATA:  Generalized weakness for several weeks, on warfarin. History of polio with LEFT-sided weakness. EXAM: CT HEAD WITHOUT CONTRAST TECHNIQUE: Contiguous axial images were obtained from the base of the skull through the vertex without intravenous contrast. COMPARISON:  None. FINDINGS: Brain: Mild chronic small vessel ischemic changes within the deep periventricular white matter regions bilaterally. No mass, hemorrhage, edema or other evidence of acute parenchymal abnormality. No extra-axial hemorrhage. Vascular: Chronic calcified atherosclerotic changes of the large vessels at the skull base. No unexpected hyperdense vessel. Skull: Normal. Negative for fracture or focal lesion. Sinuses/Orbits: No acute finding. Other: None. IMPRESSION: 1. No acute findings.  No intracranial mass, hemorrhage or edema. 2. Mild chronic small vessel ischemic changes in the white matter. Electronically Signed   By: Bary RichardStan  Maynard M.D.   On: 12/06/2018 13:17   Dg Chest Port 1 View  Result Date: 12/06/2018 CLINICAL DATA:  LEFT-sided weakness secondary to polio. Productive cough and sore throat for 4 days. Generalized weakness for several weeks. EXAM: PORTABLE CHEST 1 VIEW COMPARISON:  Chest x-ray dated 05/07/2016. FINDINGS: Heart size and mediastinal contours are stable. Lungs are clear. No pleural effusion or pneumothorax seen. No acute or suspicious osseous finding. Stable scoliosis  of the thoracolumbar spine. Atherosclerosis noted at the aortic arch. IMPRESSION: 1. No active disease. No evidence of pneumonia or pulmonary edema. 2. Aortic atherosclerosis. Electronically Signed   By: Bary RichardStan  Maynard M.D.   On: 12/06/2018 13:43    Procedures Procedures (including critical care time)  Medications Ordered in ED Medications  cefTRIAXone (ROCEPHIN) 1 g in sodium chloride 0.9 % 100 mL IVPB (1 g Intravenous New Bag/Given 12/06/18 1421)     Initial Impression / Assessment and Plan / ED Course  I have reviewed the triage vital signs and the nursing notes.  Pertinent labs & imaging results that were available during my care of the patient were reviewed by me and considered in my medical decision making (see chart for details).        Alert, pleasant, mentating appropriately with reassuring vital signs.  His cough is improving, differential includes COVID-19 although I feel this is less likely given that it is getting better without complication.  His work-up shows reassuring BMP, CBC with WBC 16.3, urinalysis is suggestive of infection.  Urine culture sent.  Chest x-ray and head CT negative acute.  Gave the patient a dose of ceftriaxone.  I suspect that UTI may be contributing to generalized weakness and malaise.  I did recommend outpatient COVID-19 testing which has been sent.  Discussed that it would take 5 to 7 days for results.  Discussed treatment for UTI and follow-up with PCP to ensure improvement in symptoms.  I have extensively reviewed return precautions with him and he voiced understanding.  He does have 24-hour home health care who will be able to keep watch over symptoms. Final Clinical Impressions(s) / ED Diagnoses   Final diagnoses:  Urinary tract infection without hematuria, site unspecified  Generalized weakness  Cough    ED Discharge Orders         Ordered    cephALEXin (KEFLEX) 500 MG capsule  2 times daily     12/06/18 1442           Willett Lefeber, Ambrose Finlandachel  Morgan, MD 12/06/18 1452

## 2018-12-06 NOTE — ED Notes (Signed)
[facility] called  

## 2018-12-07 LAB — NOVEL CORONAVIRUS, NAA (HOSP ORDER, SEND-OUT TO REF LAB; TAT 18-24 HRS): SARS-CoV-2, NAA: NOT DETECTED

## 2018-12-08 LAB — URINE CULTURE
Culture: >=100000 — AB
Special Requests: NORMAL

## 2018-12-09 ENCOUNTER — Other Ambulatory Visit: Payer: Self-pay

## 2018-12-09 ENCOUNTER — Telehealth: Payer: Self-pay

## 2018-12-09 ENCOUNTER — Emergency Department (HOSPITAL_COMMUNITY)
Admission: EM | Admit: 2018-12-09 | Discharge: 2018-12-10 | Disposition: A | Discharge status: Home / Self Care | Payer: Medicare Other | Attending: Emergency Medicine | Admitting: Emergency Medicine

## 2018-12-09 DIAGNOSIS — I10 Essential (primary) hypertension: Secondary | ICD-10-CM | POA: Insufficient documentation

## 2018-12-09 DIAGNOSIS — N390 Urinary tract infection, site not specified: Secondary | ICD-10-CM | POA: Diagnosis not present

## 2018-12-09 DIAGNOSIS — Z7901 Long term (current) use of anticoagulants: Secondary | ICD-10-CM | POA: Insufficient documentation

## 2018-12-09 DIAGNOSIS — Z87891 Personal history of nicotine dependence: Secondary | ICD-10-CM | POA: Insufficient documentation

## 2018-12-09 DIAGNOSIS — Z79899 Other long term (current) drug therapy: Secondary | ICD-10-CM | POA: Diagnosis not present

## 2018-12-09 DIAGNOSIS — R300 Dysuria: Secondary | ICD-10-CM | POA: Diagnosis present

## 2018-12-09 LAB — URINALYSIS, ROUTINE W REFLEX MICROSCOPIC
Bilirubin Urine: NEGATIVE
Glucose, UA: NEGATIVE mg/dL
Hgb urine dipstick: NEGATIVE
Ketones, ur: 5 mg/dL — AB
Nitrite: NEGATIVE
Protein, ur: NEGATIVE mg/dL
Specific Gravity, Urine: 1.009 (ref 1.005–1.030)
WBC, UA: >50 WBC/hpf — ABNORMAL HIGH (ref 0–5)
pH: 6 (ref 5.0–8.0)

## 2018-12-09 LAB — CBC WITH DIFFERENTIAL/PLATELET
Abs Immature Granulocytes: 0.11 10*3/uL — ABNORMAL HIGH (ref 0.00–0.07)
Basophils Absolute: 0.1 10*3/uL (ref 0.0–0.1)
Basophils Relative: 1 %
Eosinophils Absolute: 0.3 10*3/uL (ref 0.0–0.5)
Eosinophils Relative: 2 %
HCT: 42.8 % (ref 39.0–52.0)
Hemoglobin: 13.5 g/dL (ref 13.0–17.0)
Immature Granulocytes: 1 %
Lymphocytes Relative: 20 %
Lymphs Abs: 3.4 10*3/uL (ref 0.7–4.0)
MCH: 30.1 pg (ref 26.0–34.0)
MCHC: 31.5 g/dL (ref 30.0–36.0)
MCV: 95.5 fL (ref 80.0–100.0)
Monocytes Absolute: 1.1 10*3/uL — ABNORMAL HIGH (ref 0.1–1.0)
Monocytes Relative: 6 %
Neutro Abs: 11.6 10*3/uL — ABNORMAL HIGH (ref 1.7–7.7)
Neutrophils Relative %: 70 %
Platelets: 411 10*3/uL — ABNORMAL HIGH (ref 150–400)
RBC: 4.48 MIL/uL (ref 4.22–5.81)
RDW: 13.2 % (ref 11.5–15.5)
WBC: 16.5 10*3/uL — ABNORMAL HIGH (ref 4.0–10.5)
nRBC: 0 % (ref 0.0–0.2)

## 2018-12-09 LAB — COMPREHENSIVE METABOLIC PANEL
ALT: 21 U/L (ref 0–44)
AST: 23 U/L (ref 15–41)
Albumin: 3.3 g/dL — ABNORMAL LOW (ref 3.5–5.0)
Alkaline Phosphatase: 60 U/L (ref 38–126)
Anion gap: 10 (ref 5–15)
BUN: 11 mg/dL (ref 8–23)
CO2: 26 mmol/L (ref 22–32)
Calcium: 9.5 mg/dL (ref 8.9–10.3)
Chloride: 100 mmol/L (ref 98–111)
Creatinine, Ser: 0.33 mg/dL — ABNORMAL LOW (ref 0.61–1.24)
GFR calc Af Amer: >60 mL/min (ref >60)
GFR calc non Af Amer: >60 mL/min (ref >60)
Glucose, Bld: 97 mg/dL (ref 70–99)
Potassium: 4.1 mmol/L (ref 3.5–5.1)
Sodium: 136 mmol/L (ref 135–145)
Total Bilirubin: 0.7 mg/dL (ref 0.3–1.2)
Total Protein: 7.4 g/dL (ref 6.5–8.1)

## 2018-12-09 LAB — PROTIME-INR
INR: 1.6 — ABNORMAL HIGH (ref 0.8–1.2)
Prothrombin Time: 18.4 seconds — ABNORMAL HIGH (ref 11.4–15.2)

## 2018-12-09 MED ORDER — NITROFURANTOIN MONOHYD MACRO 100 MG PO CAPS: 100.0000 mg | ORAL_CAPSULE | Freq: Two times a day (BID) | ORAL | 0 refills | Status: AC

## 2018-12-09 MED ORDER — NITROFURANTOIN MONOHYD MACRO 100 MG PO CAPS
100.0000 mg | ORAL_CAPSULE | Freq: Once | ORAL | Status: AC
  Administered 2018-12-09: 100 mg via ORAL
  Filled 2018-12-09: qty 1

## 2018-12-09 NOTE — Telephone Encounter (Signed)
Called Pt's care giver and instructed per Roselyn Reef Ward PA to Return to ED for IV abx for UC resistant to Keflex. Pt with uncertain Warfarin level and will not have appt with clinic until Wednesday with Hulan Fess MD [place]

## 2018-12-09 NOTE — ED Triage Notes (Signed)
Pt arrived via EMS from home. Pt  Was told that pt was being treated for UTI 2 days ago seen in [place] and and placed on ABX. Per EMS PA Ward instructed pt to come to ed as medication were having adverse effects on coumadin RX.    EMS 110/64,  HR 94 , 18 RR , 95% RA.

## 2018-12-09 NOTE — ED Provider Notes (Signed)
[facility]-EMERGENCY DEPT Provider Note   CSN: [id number] Arrival date & time: 12/09/18  1450     History   Chief Complaint Chief Complaint  Patient presents with  . Urinary Tract Infection    HPI Damon Colon is a 78 y.o. male.     The history is provided by the patient and medical records. No language interpreter was used.  Dysuria Presenting symptoms: dysuria   Relieved by:  Nothing Worsened by:  Nothing Ineffective treatments:  None tried Associated symptoms: no abdominal pain, no diarrhea, no fever, no flank pain, no nausea, no urinary frequency and no vomiting     Past Medical History:  Diagnosis Date  . Arthritis   . Bruises easily   . Fever    102  . Hypertension   . Polio   . 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 Wears glasses     Patient Active Problem List   Diagnosis Date Noted  . Neurogenic bladder disorder 04/02/2014  . 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 APPENDECTOMY    . COLONOSCOPY    . NECK SURGERY     c7- titanium plates put in  . SPINE SURGERY     3-stage spine fusion  . TONSILLECTOMY AND ADENOIDECTOMY    . URETHRAL DIVERTICULECTOMY          Home Medications    Prior to Admission medications   Medication Sig Start Date End Date Taking? Authorizing Provider  acetaminophen (TYLENOL) 500 MG tablet Take 500 mg by mouth every 6 (six) hours as needed for mild pain, moderate pain or headache.     [provider]  cephALEXin (KEFLEX) 500 MG capsule Take 1 capsule (500 mg total) by mouth 2 (two) times daily. 12/06/18   Little, Ambrose Finlandachel Morgan, MD  finasteride (PROSCAR) 5 MG tablet Take 5 mg by mouth Daily.     [provider]  lisinopril (PRINIVIL,ZESTRIL) 5 MG tablet Take 5 mg by mouth every evening.     [provider]  loratadine (CLARITIN) 10 MG tablet Take 10 mg by mouth daily.    [provider]  Meth-Hyo-M Bl-Na Phos-Ph Sal (URIBEL) 118 MG CAPS Take 118 mg by mouth every 6 (six) hours.     [provider]  Multiple Vitamin (MULTIVITAMIN WITH MINERALS) TABS tablet Take 1 tablet by mouth daily.     [provider]  pravastatin (PRAVACHOL) 40 MG tablet Take 40 mg by mouth every evening.    [provider]  traMADol (ULTRAM) 50 MG tablet Take 50 mg by mouth 3 (three) times daily.     [provider]  warfarin (COUMADIN) 3 MG tablet Take 4.5 mg by mouth every evening.     [provider]    Family History No family history on file.  Social History Social History   Tobacco Use  . Smoking status: Former Games developermoker  . Smokeless tobacco: Never Used  . Tobacco comment: 1981  Substance Use Topics  . Alcohol use: Yes    Comment: occa  . Drug use: No     Allergies   Codeine   Review of Systems Review of Systems  Constitutional: Negative for chills, diaphoresis, fatigue and fever.  HENT: Negative for congestion.   Respiratory: Positive for cough. Negative for chest tightness, shortness of breath and wheezing.  Cardiovascular: Negative for chest pain.  Gastrointestinal: Negative for abdominal pain, diarrhea, nausea and vomiting.  Genitourinary: Positive for dysuria. Negative for flank pain and frequency.  Neurological: Negative for light-headedness and headaches.  All other systems reviewed and are negative.    Physical Exam Updated Vital Signs BP 120/66 (BP Location: Right Arm)   Pulse 75   Temp 98.4 F (36.9 C) (Oral)   Resp 17   SpO2 98%   Physical Exam Vitals signs and nursing note reviewed.  Constitutional:      General: He is not in acute distress.    Appearance: He is well-developed. He is not ill-appearing, toxic-appearing or diaphoretic.  HENT:     Head: Normocephalic and atraumatic.     Mouth/Throat:      Pharynx: No oropharyngeal exudate.  Eyes:     Conjunctiva/sclera: Conjunctivae normal.     Pupils: Pupils are equal, round, and reactive to light.  Neck:     Musculoskeletal: Neck supple.  Cardiovascular:     Rate and Rhythm: Normal rate and regular rhythm.     Heart sounds: No murmur.  Pulmonary:     Effort: Pulmonary effort is normal. No respiratory distress.     Breath sounds: Normal breath sounds. No wheezing, rhonchi or rales.  Chest:     Chest wall: No tenderness.  Abdominal:     Palpations: Abdomen is soft.     Tenderness: There is no abdominal tenderness.  Musculoskeletal:        General: No tenderness.  Skin:    General: Skin is warm and dry.  Neurological:     Mental Status: He is alert and oriented to person, place, and time.      ED Treatments / Results  Labs (all labs ordered are listed, but only abnormal results are displayed) Labs Reviewed  CBC WITH DIFFERENTIAL/PLATELET - Abnormal; Notable for the following components:      Result Value   WBC 16.5 (*)    Platelets 411 (*)    Neutro Abs 11.6 (*)    Monocytes Absolute 1.1 (*)    Abs Immature Granulocytes 0.11 (*)    All other components within normal limits  COMPREHENSIVE METABOLIC PANEL - Abnormal; Notable for the following components:   Creatinine, Ser 0.33 (*)    Albumin 3.3 (*)    All other components within normal limits  URINALYSIS, ROUTINE W REFLEX MICROSCOPIC - Abnormal; Notable for the following components:   APPearance CLOUDY (*)    Ketones, ur 5 (*)    Leukocytes,Ua LARGE (*)    WBC, UA >50 (*)    Bacteria, UA RARE (*)    Non Squamous Epithelial 0-5 (*)    All other components within normal limits  PROTIME-INR - Abnormal; Notable for the following components:   Prothrombin Time 18.4 (*)    INR 1.6 (*)    All other components within normal limits  URINE CULTURE    EKG None  Radiology No results found.  Procedures Procedures (including critical care time)  Medications Ordered  in ED Medications  nitrofurantoin (macrocrystal-monohydrate) (MACROBID) capsule 100 mg (has no administration in time range)     Initial Impression / Assessment and Plan / ED Course  I have reviewed the triage vital signs and the nursing notes.  Pertinent labs & imaging results that were available during my care of the patient were reviewed by me and considered in my medical decision making (see chart for details).        Damon Colon is a 78 y.o. male with a past medical history significant for post polymyositis muscular atrophy, neurogenic bladder, quadriplegia/quadriparesis, prior cervical spine injury and recent diagnosis of urinary tract infection 2 days ago who presents for reassessment for UTI.  Patient reports that he was started on Keflex after receiving a dose of Rocephin and was sent home for UTI.  Patient reports he still having dysuria and his culture today showed that the infection is MRSA and resistant to the Keflex.  Patient was told to come back in for a reassessment and decision on antibiotics.  The patient reports he still having some dysuria but denies any fevers, chills, nausea vomiting, abdominal pain, flank pain, or back pain.  He otherwise reports he has had a dry cough that is unchanged.  On exam, lungs are clear and chest is nontender.  Abdomen is nontender.  Patient has weakness in his extremities which he reports is unchanged from his baseline.  Patient otherwise is feeling well.  Shear decision made conversation held with patient.  Chart review shows that patient's infection is indeed resistant to the Keflex.  It appears to be sensitive to nitrofurantoin and Bactrim however patient was concerned about his interaction with Coumadin as he has had a very labile management of his INR recently.  Patient is not interested in antibiotics that might affect this.  I spoke with pharmacy who reports that the nitrofurantoin would be appropriate and would have minimum  interaction with his Coumadin management but would be most effective if it was more of a lower urinary tract infection versus a pyelonephritis.  The Bactrim would reportedly be more interactive with his medications.  Patient agreed to get some screening laboratory testing and then we can decide plan of care.  Given the patient's lack of any fevers, chills, flank pain, nausea, vomiting, or back pain, low suspicion for pyelonephritis at this time.   If labs are reassuring, anticipate discharge with nitrofurantoin with close follow-up instructions.  7:37 PM Laboratory testing returned similar to prior.  Leukocytosis is essentially unchanged at 16.5.  Patient continues to deny any symptoms of pyelonephritis and would like to try nitrofurantoin at discharge.  He will stop the Keflex and continue his home medication regimen otherwise.  Patient will follow-up with PCP and understood return precautions.  He understands to follow-up with his PCP for further Coumadin management recommendations.    Patient discharged in good condition.   Final Clinical Impressions(s) / ED Diagnoses   Final diagnoses:  Lower urinary tract infectious disease    ED Discharge Orders         Ordered    nitrofurantoin, macrocrystal-monohydrate, (MACROBID) 100 MG capsule  2 times daily     12/09/18 1938         Clinical Impression: 1. Lower urinary tract infectious disease     Disposition: Discharge  Condition: Good  I have discussed the results, Dx and Tx plan with the pt(& family if present). He/she/they expressed understanding and agree(s) with the plan. Discharge instructions discussed at great length. Strict return precautions discussed and pt &/or family have verbalized understanding of the instructions. No further questions at time of discharge.    New Prescriptions   NITROFURANTOIN, MACROCRYSTAL-MONOHYDRATE, (MACROBID) 100 MG CAPSULE    Take 1 capsule (100 mg total) by mouth 2 (two) times daily for 5  days.    Follow Up: Hulan Fess, MD [place] [address] [id number]     [place]  DEPT [address] [id number] mc [address] [id number] [phone]       Tegeler, Canary Brimhristopher J, MD 12/09/18 [id number]

## 2018-12-09 NOTE — Discharge Instructions (Signed)
Your work-up today continues to show a urinary tract infection that is resistant to the antibiotic you were previously prescribed.  We spoke with pharmacy who recommended switching to nitrofurantoin to take for the next 5 days.  We gave you tonight's dose.  Please stay hydrated and rest and follow-up with your primary doctor.  Please stop taking the Keflex previously prescribed.  Please continue your home regimen and call your PCP to discuss further Coumadin management as your INR was low similar to prior today.  If any symptoms change or worsen or you develop symptoms of the kidney infection such as fevers, chills, nausea, vomiting, flank pain, abdominal pain, or back pain, please return to nearest emergency department as you may require admission for IV antibiotics at that time.

## 2018-12-09 NOTE — ED Notes (Signed)
Notified [facility] for transportation again since [employer] is unaware if he is on the originally [facility] list to be transported home.

## 2018-12-09 NOTE — Progress Notes (Signed)
ED Antimicrobial Stewardship Positive Culture Follow Up   Damon Colon is an 78 y.o. male who presented to [facility] on 12/06/2018 with a chief complaint of  Chief Complaint  Patient presents with  . Sore Throat  . Cough  . Weakness    Recent Results (from the past 720 hour(s))  Urine culture     Status: Abnormal   Collection Time: 12/06/18 12:28 PM   Specimen: Urine, Clean Catch  Result Value Ref Range Status   Specimen Description   Final    URINE, CLEAN CATCH Performed at [facility], [place] [address], [place], [place] [address]    Special Requests   Final    Normal Performed at [facility], [place] [address] [place], [place] [address]    Culture (A)  Final    >=100,000 COLONIES/mL METHICILLIN RESISTANT STAPHYLOCOCCUS AUREUS   Report Status 12/08/2018 FINAL  Final   Organism ID, Bacteria METHICILLIN RESISTANT STAPHYLOCOCCUS AUREUS (A)  Final      Susceptibility   Methicillin resistant staphylococcus aureus - MIC*    CIPROFLOXACIN >=8 RESISTANT Resistant     GENTAMICIN <=0.5 SENSITIVE Sensitive     NITROFURANTOIN <=16 SENSITIVE Sensitive     OXACILLIN >=4 RESISTANT Resistant     TETRACYCLINE <=1 SENSITIVE Sensitive     VANCOMYCIN <=0.5 SENSITIVE Sensitive     TRIMETH/SULFA <=10 SENSITIVE Sensitive     CLINDAMYCIN RESISTANT Resistant     RIFAMPIN <=0.5 SENSITIVE Sensitive     Inducible Clindamycin POSITIVE Resistant     * >=100,000 COLONIES/mL METHICILLIN RESISTANT STAPHYLOCOCCUS AUREUS  Novel Coronavirus,NAA,(SEND-OUT TO REF LAB - TAT 24-48 hrs); Hosp Order     Status: None   Collection Time: 12/06/18  3:03 PM   Specimen: Nasopharyngeal Swab; Respiratory  Result Value Ref Range Status   SARS-CoV-2, NAA NOT DETECTED NOT DETECTED Final    Comment: (NOTE) This test was developed and its performance characteristics determined by [employer]. This test has not been FDA cleared or approved. This test has been authorized by  FDA under an Emergency Use Authorization (EUA). This test is only authorized for the duration of time the declaration that circumstances exist justifying the authorization of the emergency use of in vitro diagnostic tests for detection of SARS-CoV-2 virus and/or diagnosis of COVID-19 infection under section 564(b)(1) of the Act, 21 U.S.C. 664QIH-4(V)(4), unless the authorization is terminated or revoked sooner. When diagnostic testing is negative, the possibility of a false negative result should be considered in the context of a patient's recent exposures and the presence of clinical signs and symptoms consistent with COVID-19. An individual without symptoms of COVID-19 and who is not shedding SARS-CoV-2 virus would expect to have a negative (not detected) result in this assay. Performed  At: [facility] [place], [place] [id number] Rush Farmer MD IE:[id number]    [place]  Final    Comment: Performed at [facility] [address], [place], [place] [address]    Allergies  Allergen Reactions  . Codeine Other (See Comments)    Reaction:  Somnolence    No antibiotic or sulfa allergy listed in chart.   [x]  Treated with cephalexin, organism resistant to prescribed antimicrobial  Pt prescribed cephalexin for UTI, urine culture growing MRSA.  Per discussion with ED PA, the following plan/instructions faxed to patient placement RN:   1) Tell patient to stop taking cephalexin 2) Talk to patient or caregiver and ensure that patient has  a PCP for immediate follow up. Patient is on warfarin (drug interaction with Bactrim) - must ensure that patient can contact clinic managing warfarin dosing TODAY for instruction for dose adjustment given antibiotic changes and that patient can get INR checked in the next 24 hours. Pt will need to make sure that warfarin clinic is aware of new oral antibiotic prescription for necessary  warfarin dose adjustments. 3) IF patient is able to contact warfarin clinic today and able to have follow up with warfarin clinic for INR check within 24 hours; then call in prescription for Bactrim DS 1 tablet PO BID x 7 days with follow up with PCP this week. No refills.  4) If patient is unable to ensure immediate warfarin clinic follow up or/and if patient does not have PCP, will not call in a new prescription for Bactrim. In that case, will need to instruct patient to return to hospital for IV antibiotics.   New antibiotic prescription: See above.  ED Provider: Elizabeth SauerJaime Ward, PA-C   Cindi CarbonMary M Rydan Gulyas, PharmD 12/09/2018, 1:11 PM [phone]

## 2018-12-09 NOTE — ED Notes (Signed)
[facility] called  

## 2018-12-10 LAB — URINE CULTURE: Culture: <10000 — AB

## 2018-12-10 NOTE — ED Notes (Signed)
[facility] is here to transport patient home. Patient is alert, oriented, and no in distress.

## 2019-01-27 ENCOUNTER — Other Ambulatory Visit: Payer: Self-pay | Admitting: Family Medicine

## 2019-01-27 DIAGNOSIS — I779 Disorder of arteries and arterioles, unspecified: Secondary | ICD-10-CM

## 2019-02-06 ENCOUNTER — Encounter (HOSPITAL_COMMUNITY): Payer: Self-pay

## 2019-02-06 ENCOUNTER — Other Ambulatory Visit: Payer: Self-pay

## 2019-02-06 ENCOUNTER — Emergency Department (HOSPITAL_COMMUNITY)
Admission: EM | Admit: 2019-02-06 | Discharge: 2019-02-06 | Disposition: A | Discharge status: Home / Self Care | Payer: Medicare Other | Attending: Emergency Medicine | Admitting: Emergency Medicine

## 2019-02-06 DIAGNOSIS — I10 Essential (primary) hypertension: Secondary | ICD-10-CM | POA: Insufficient documentation

## 2019-02-06 DIAGNOSIS — N3001 Acute cystitis with hematuria: Secondary | ICD-10-CM | POA: Insufficient documentation

## 2019-02-06 DIAGNOSIS — R791 Abnormal coagulation profile: Secondary | ICD-10-CM | POA: Diagnosis not present

## 2019-02-06 DIAGNOSIS — R531 Weakness: Secondary | ICD-10-CM | POA: Diagnosis present

## 2019-02-06 DIAGNOSIS — N320 Bladder-neck obstruction: Secondary | ICD-10-CM | POA: Diagnosis not present

## 2019-02-06 DIAGNOSIS — Z87891 Personal history of nicotine dependence: Secondary | ICD-10-CM | POA: Diagnosis not present

## 2019-02-06 DIAGNOSIS — Z79899 Other long term (current) drug therapy: Secondary | ICD-10-CM | POA: Diagnosis not present

## 2019-02-06 HISTORY — DX: Paraplegia, unspecified: G82.20

## 2019-02-06 LAB — CBC WITH DIFFERENTIAL/PLATELET
Abs Immature Granulocytes: 0.05 10*3/uL (ref 0.00–0.07)
Basophils Absolute: 0 10*3/uL (ref 0.0–0.1)
Basophils Relative: 0 %
Eosinophils Absolute: 0 10*3/uL (ref 0.0–0.5)
Eosinophils Relative: 0 %
HCT: 46 % (ref 39.0–52.0)
Hemoglobin: 14.6 g/dL (ref 13.0–17.0)
Immature Granulocytes: 0 %
Lymphocytes Relative: 9 %
Lymphs Abs: 1.2 10*3/uL (ref 0.7–4.0)
MCH: 30.4 pg (ref 26.0–34.0)
MCHC: 31.7 g/dL (ref 30.0–36.0)
MCV: 95.6 fL (ref 80.0–100.0)
Monocytes Absolute: 0.6 10*3/uL (ref 0.1–1.0)
Monocytes Relative: 5 %
Neutro Abs: 11 10*3/uL — ABNORMAL HIGH (ref 1.7–7.7)
Neutrophils Relative %: 86 %
Platelets: 289 10*3/uL (ref 150–400)
RBC: 4.81 MIL/uL (ref 4.22–5.81)
RDW: 14.2 % (ref 11.5–15.5)
WBC: 12.8 10*3/uL — ABNORMAL HIGH (ref 4.0–10.5)
nRBC: 0 % (ref 0.0–0.2)

## 2019-02-06 LAB — URINALYSIS, ROUTINE W REFLEX MICROSCOPIC
Bacteria, UA: NONE SEEN
Bilirubin Urine: NEGATIVE
Glucose, UA: NEGATIVE mg/dL
Ketones, ur: 80 mg/dL — AB
Nitrite: NEGATIVE
Protein, ur: 100 mg/dL — AB
RBC / HPF: >50 RBC/hpf — ABNORMAL HIGH (ref 0–5)
Specific Gravity, Urine: 1.023 (ref 1.005–1.030)
WBC, UA: >50 WBC/hpf — ABNORMAL HIGH (ref 0–5)
pH: 5 (ref 5.0–8.0)

## 2019-02-06 LAB — BASIC METABOLIC PANEL
Anion gap: 15 (ref 5–15)
BUN: 15 mg/dL (ref 8–23)
CO2: 22 mmol/L (ref 22–32)
Calcium: 9.7 mg/dL (ref 8.9–10.3)
Chloride: 100 mmol/L (ref 98–111)
Creatinine, Ser: 0.47 mg/dL — ABNORMAL LOW (ref 0.61–1.24)
GFR calc Af Amer: >60 mL/min (ref >60)
GFR calc non Af Amer: >60 mL/min (ref >60)
Glucose, Bld: 113 mg/dL — ABNORMAL HIGH (ref 70–99)
Potassium: 4.4 mmol/L (ref 3.5–5.1)
Sodium: 137 mmol/L (ref 135–145)

## 2019-02-06 LAB — PROTIME-INR
INR: 6.6 (ref 0.8–1.2)
Prothrombin Time: 56.6 seconds — ABNORMAL HIGH (ref 11.4–15.2)

## 2019-02-06 MED ORDER — CEPHALEXIN 500 MG PO CAPS: 500.0000 mg | ORAL_CAPSULE | Freq: Three times a day (TID) | ORAL | 0 refills | Status: DC

## 2019-02-06 MED ORDER — CEPHALEXIN 500 MG PO CAPS
500.0000 mg | ORAL_CAPSULE | Freq: Once | ORAL | Status: AC
  Administered 2019-02-06: 500 mg via ORAL
  Filled 2019-02-06: qty 1

## 2019-02-06 NOTE — ED Triage Notes (Signed)
Pt reports that he was not able to stand. Pt is c/o lower abdominal pain 5/10 and associated diarrhea.

## 2019-02-06 NOTE — ED Notes (Signed)
Bladder scan showed 0mL.  

## 2019-02-06 NOTE — ED Notes (Signed)
Attempted in and out cath without success unable to advance past the prostate.

## 2019-02-06 NOTE — ED Triage Notes (Signed)
Pt arrived via EMS from home. Pt c/o generalized weakness, and diarrhea x 3 day.  BP 120/88, HR 90, RR 16

## 2019-02-06 NOTE — ED Notes (Signed)
Bladder scan showed 64mL

## 2019-02-06 NOTE — ED Provider Notes (Signed)
[facility]-EMERGENCY DEPT Provider Note   CSN: [id number] Arrival date & time: 02/06/19  1006     History   Chief Complaint Chief Complaint  Patient presents with  . generalized weakness    HPI Damon Colon is a 78 y.o. male.     HPI   Patient is unable to specify why he is here.  I discussed the case with his sister, Harriett Sine, with whom he lives.  She states that she called EMS because he has been weaker than usual since yesterday, and needs to have a PT/INR test done.  She is also concerned that he may have a UTI.  The patient has home health staff, 24/7.  He has post polio muscular dystrophy, and is quadriparetic.  There has been no recent vomiting, fever or cough.  Level 5 caveat-poor historian  Past Medical History:  Diagnosis Date  . Arthritis   . Bruises easily   . Fever    102  . Hypertension   . Polio   . 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 Wears glasses     Patient Active Problem List   Diagnosis Date Noted  . Neurogenic bladder disorder 04/02/2014  . 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 APPENDECTOMY    . COLONOSCOPY    . NECK SURGERY     c7- titanium plates put in  . SPINE SURGERY     3-stage spine fusion  . TONSILLECTOMY AND ADENOIDECTOMY    . URETHRAL DIVERTICULECTOMY          Home Medications    Prior to Admission medications   Medication Sig Start Date End Date Taking? Authorizing Provider  acetaminophen (TYLENOL) 500 MG tablet Take 500 mg by mouth every 6 (six) hours as needed for mild pain, moderate pain or headache.    Yes [provider]  finasteride (PROSCAR) 5 MG tablet Take 5 mg by mouth Daily.    Yes [provider]  Multiple Vitamin (MULTIVITAMIN WITH MINERALS) TABS tablet Take 1 tablet by mouth daily.   Yes  [provider]  polyethylene glycol (MIRALAX / GLYCOLAX) 17 g packet Take 17 g by mouth daily as needed for mild constipation.   Yes [provider]  pravastatin (PRAVACHOL) 80 MG tablet Take 80 mg by mouth daily.  10/05/18  Yes [provider]  traMADol (ULTRAM) 50 MG tablet Take 50 mg by mouth 3 (three) times daily.    Yes [provider]  warfarin (COUMADIN) 3 MG tablet Take 3 mg by mouth every evening.    Yes [provider]    Family History History reviewed. No pertinent family history.  Social History Social History   Tobacco Use  . Smoking status: Former Games developer  . Smokeless tobacco: Never Used  . Tobacco comment: 1981  Substance Use Topics  . Alcohol use: Yes    Comment: occa  . Drug use: No     Allergies   Codeine   Review of Systems Review of Systems  Unable to perform ROS: Mental status change     Physical Exam Updated Vital Signs BP 140/74   Pulse (!) 105   Temp 98.3 F (36.8 C) (Oral)   Resp 16   SpO2 94%   Physical Exam Vitals signs and nursing note reviewed.  Constitutional:      General: He  is not in acute distress.    Appearance: He is well-developed. He is not ill-appearing, toxic-appearing or diaphoretic.     Comments: Elderly, frail  HENT:     Head: Normocephalic and atraumatic.     Right Ear: External ear normal.     Left Ear: External ear normal.     Mouth/Throat:     Mouth: Mucous membranes are moist.     Pharynx: No oropharyngeal exudate or posterior oropharyngeal erythema.  Eyes:     Conjunctiva/sclera: Conjunctivae normal.     Pupils: Pupils are equal, round, and reactive to light.  Neck:     Musculoskeletal: Normal range of motion and neck supple.     Trachea: Phonation normal.  Cardiovascular:     Rate and Rhythm: Normal rate and regular rhythm.     Heart sounds: Normal heart sounds.  Pulmonary:     Effort: Pulmonary effort is normal.     Breath sounds: Normal breath sounds.   Abdominal:     General: There is no distension.     Palpations: Abdomen is soft.     Tenderness: There is no abdominal tenderness.  Musculoskeletal: Normal range of motion.     Comments: Spastic paresis, weak arms and legs  Skin:    General: Skin is warm and dry.     Coloration: Skin is not jaundiced.  Neurological:     Mental Status: He is alert and oriented to person, place, and time.     Cranial Nerves: No cranial nerve deficit.     Sensory: No sensory deficit.     Motor: No abnormal muscle tone.     Coordination: Coordination normal.  Psychiatric:        Mood and Affect: Mood normal.        Behavior: Behavior normal.      ED Treatments / Results  Labs (all labs ordered are listed, but only abnormal results are displayed) Labs Reviewed  PROTIME-INR - Abnormal; Notable for the following components:      Result Value   Prothrombin Time 56.6 (*)    INR 6.6 (*)    All other components within normal limits  BASIC METABOLIC PANEL - Abnormal; Notable for the following components:   Glucose, Bld 113 (*)    Creatinine, Ser 0.47 (*)    All other components within normal limits  CBC WITH DIFFERENTIAL/PLATELET - Abnormal; Notable for the following components:   WBC 12.8 (*)    Neutro Abs 11.0 (*)    All other components within normal limits  URINALYSIS, ROUTINE W REFLEX MICROSCOPIC    EKG None  Radiology No results found.  Procedures BLADDER CATHETERIZATION  Date/Time: 02/06/2019 3:03 PM Performed by: Mancel BaleWentz, Nereyda Bowler, MD Authorized by: Mancel BaleWentz, Jenaya Saar, MD   Consent:    Consent obtained:  Verbal   Consent given by:  Patient   Risks discussed:  Infection and pain   Alternatives discussed:  No treatment Pre-procedure details:    Procedure purpose:  Diagnostic   Preparation: Patient was prepped and draped in usual sterile fashion   Anesthesia (see MAR for exact dosages):    Anesthesia method:  None Procedure details:    Provider performed due to:  Nurse unable to  complete   Catheter insertion:  Indwelling   Catheter type:  Coude and Foley   Catheter size:  16 Fr   Bladder irrigation: yes     Number of attempts:  1   Urine appearance: Unable to obtain urine flow until irrigated with  20 cc of saline.  At that point a dark yellow urine returned without evidence for bleeding. Post-procedure details:    Patient tolerance of procedure:  Tolerated well, no immediate complications Comments:     Irrigated bladder with 250 cc saline, to improve flow, will check urinalysis, 1 hour later.   (including critical care time)  Medications Ordered in ED Medications - No data to display   Initial Impression / Assessment and Plan / ED Course  I have reviewed the triage vital signs and the nursing notes.  Pertinent labs & imaging results that were available during my care of the patient were reviewed by me and considered in my medical decision making (see chart for details).  Clinical Course as of Feb 06 1507  Thu Feb 06, 2019  1423 Normal except glucose high, creatinine low  Basic metabolic panel(!) [EW]  5465 Supratherapeutic  Protime-INR(!!) [EW]  1424 Abnormal, white count elevated  CBC with Differential(!) [EW]    Clinical Course User Index [EW] Daleen Bo, MD        Patient Vitals for the past 24 hrs:  BP Temp Temp src Pulse Resp SpO2  02/06/19 1430 140/74 - - (!) 105 16 94 %  02/06/19 1400 128/67 - - 98 - 97 %  02/06/19 1330 (!) 144/70 - - 89 18 95 %  02/06/19 1230 (!) 146/66 - - 95 16 96 %  02/06/19 1200 (!) 143/69 - - (!) 104 18 95 %  02/06/19 1130 (!) 150/69 - - (!) 102 16 95 %  02/06/19 1020 125/89 98.3 F (36.8 C) Oral (!) 111 20 100 %    3:05 PM Reevaluation with update and discussion. After initial assessment and treatment, an updated evaluation reveals patient more comfortable after Foley catheter placed, by me.  Currently being irrigated.  Will obtain urinalysis in 1 hour.Daleen Bo   Medical Decision Making:  Nonspecific weakness.  INR elevated, Coumadin will need to be held.  Nonspecific urinary abnormality, with difficulty passing catheter, requiring coud.  Suspect prostatic hypertrophy.  Final disposition, will be after obtaining urinalysis.  Patient should be able to go home.  Foley catheter will be maintained, at discharge.  CRITICAL CARE-no Performed by: Daleen Bo  Nursing Notes Reviewed/ Care Coordinated Applicable Imaging Reviewed Interpretation of Laboratory Data incorporated into ED treatment   Plan-disposition per oncoming provider team, following period of observation and obtain a urinalysis.  Final Clinical Impressions(s) / ED Diagnoses   Final diagnoses:  Bladder outlet obstruction  Supratherapeutic international normalized ratio (INR)    ED Discharge Orders    None       Daleen Bo, MD 02/06/19 1709

## 2019-02-06 NOTE — ED Notes (Signed)
Pt calleld out stating he had to urinate, assisted in holding urinal and pt was unable to urinate at this time.

## 2019-02-06 NOTE — Discharge Instructions (Addendum)
Your Coumadin level is elevated.  Do not take the Coumadin dose until 02/08/2019, then resume usual daily dosing.  Have your doctor recheck your INR level in  3 or 4 days. Make an appointment to follow-up with urology within the next week.

## 2019-02-06 NOTE — ED Notes (Signed)
CRITICAL VALUE STICKER  CRITICAL VALUE: INR 6.6  DATE & TIME NOTIFIED: 02/06/19 1303  MD NOTIFIED: Eulis Srinivasan MD  TIME OF NOTIFICATION: [phone]

## 2019-02-06 NOTE — ED Notes (Signed)
Spoke to family member who stated best means to transfer pt home was [facility].

## 2019-02-06 NOTE — ED Notes (Signed)
[facility] Called 

## 2019-02-09 LAB — URINE CULTURE: Culture: >=100000 — AB

## 2019-02-10 ENCOUNTER — Telehealth: Payer: Self-pay | Admitting: Emergency Medicine

## 2019-02-10 NOTE — Telephone Encounter (Signed)
Post ED Visit - Positive Culture Follow-up: Successful Patient Follow-Up  Culture assessed and recommendations reviewed by:  []  Elenor Quinones, Pharm.D. []  Heide Guile, Pharm.D., BCPS AQ-ID []  Parks Neptune, Pharm.D., BCPS []  Alycia Rossetti, Pharm.D., BCPS []  [place], Pharm.D., BCPS, AAHIVP []  Legrand Como, Pharm.D., BCPS, AAHIVP []  Salome Arnt, PharmD, BCPS []  Johnnette Gourd, PharmD, BCPS []  Hughes Better, PharmD, BCPS []  Leeroy Cha, PharmD Dia Sitter PharmD  Positive urine culture  []  Patient discharged without antimicrobial prescription and treatment is now indicated [x]  Organism is resistant to prescribed ED discharge antimicrobial []  Patient with positive blood cultures  Changes discussed with ED provider: Martinique Robinson PA New antibiotic prescription d/c keflex d/t resistant, symptom check, if urinary symptoms needs to f/u with his PCP or urologist  Attempting to contact patient  Hazle Nordmann 02/10/2019, 1:16 PM

## 2019-02-10 NOTE — Progress Notes (Signed)
ED Antimicrobial Stewardship Positive Culture Follow Up   Damon Colon is an 78 y.o. male who presented to [facility] on 02/06/2019 with a chief complaint of generalized weakness, diarrhea.  Chief Complaint  Patient presents with  . generalized weakness    Recent Results (from the past 720 hour(s))  Urine culture     Status: Abnormal   Collection Time: 02/06/19  4:38 PM   Specimen: Urine, Random  Result Value Ref Range Status   Specimen Description   Final    URINE, RANDOM Performed at [facility] [address] [place], [place] [address]    Special Requests   Final    NONE Performed at [facility], [place] [address], [place], [place] [address]    Culture (A)  Final    >=100,000 COLONIES/mL METHICILLIN RESISTANT STAPHYLOCOCCUS AUREUS   Report Status 02/09/2019 FINAL  Final   Organism ID, Bacteria METHICILLIN RESISTANT STAPHYLOCOCCUS AUREUS (A)  Final      Susceptibility   Methicillin resistant staphylococcus aureus - MIC*    CIPROFLOXACIN >=8 RESISTANT Resistant     GENTAMICIN <=0.5 SENSITIVE Sensitive     NITROFURANTOIN <=16 SENSITIVE Sensitive     OXACILLIN >=4 RESISTANT Resistant     TETRACYCLINE <=1 SENSITIVE Sensitive     VANCOMYCIN <=0.5 SENSITIVE Sensitive     TRIMETH/SULFA <=10 SENSITIVE Sensitive     CLINDAMYCIN RESISTANT Resistant     RIFAMPIN <=0.5 SENSITIVE Sensitive     Inducible Clindamycin POSITIVE Resistant     * >=100,000 COLONIES/mL METHICILLIN RESISTANT STAPHYLOCOCCUS AUREUS    [x]  Treated with cephalexin, organism resistant to prescribed antimicrobial []  Patient discharged originally without antimicrobial agent and treatment is now indicated  Plan: 1)  D/c cephalexin 2)  Ask patient if he is symptomatic 3)  If patient is symptomatic, then instruct patient he needs to follow-up with his PCP or urologist ASAP   ED Provider: Madalyn Rob, MD   Dia Sitter P 02/10/2019, 11:00 AM Clinical  Pharmacist [phone]

## 2019-02-26 ENCOUNTER — Other Ambulatory Visit: Payer: Self-pay | Admitting: Family Medicine

## 2019-02-26 ENCOUNTER — Other Ambulatory Visit (HOSPITAL_COMMUNITY): Payer: Self-pay | Admitting: Family Medicine

## 2019-02-27 ENCOUNTER — Other Ambulatory Visit (HOSPITAL_COMMUNITY): Payer: Self-pay | Admitting: Family Medicine

## 2019-02-27 DIAGNOSIS — I779 Disorder of arteries and arterioles, unspecified: Secondary | ICD-10-CM

## 2019-03-27 ENCOUNTER — Telehealth: Payer: Self-pay | Admitting: Internal Medicine

## 2019-03-27 NOTE — Telephone Encounter (Signed)
Called patient back to schedule Palliative Consult and spoke with caregiver Leta, she stated that I would need to contact patient's sister, Abriel Geesey [facility]) to schedule this.  I then called Izora Gala with no answer - left message with reason for call along with my contact information.

## 2019-04-01 ENCOUNTER — Telehealth: Payer: Self-pay | Admitting: Internal Medicine

## 2019-04-01 NOTE — Telephone Encounter (Signed)
Rec'd call back from daughter Marsh Heckler and she was in agreement with Palliative services.  I have scheduled a Telehealth Zoom Consult for 04/10/19 @ 10:30 AM

## 2019-04-02 ENCOUNTER — Encounter: Payer: Self-pay | Admitting: Neurology

## 2019-04-02 ENCOUNTER — Other Ambulatory Visit: Payer: Self-pay

## 2019-04-02 ENCOUNTER — Ambulatory Visit: Payer: Medicare Other | Admitting: Neurology

## 2019-04-02 VITALS — BP 116/75 | HR 88 | Temp 98.2°F

## 2019-04-02 DIAGNOSIS — S14109D Unspecified injury at unspecified level of cervical spinal cord, subsequent encounter: Secondary | ICD-10-CM

## 2019-04-02 DIAGNOSIS — G14 Postpolio syndrome: Secondary | ICD-10-CM | POA: Diagnosis not present

## 2019-04-02 DIAGNOSIS — G8250 Quadriplegia, unspecified: Secondary | ICD-10-CM | POA: Diagnosis not present

## 2019-04-02 NOTE — Progress Notes (Signed)
[facility]  Provider:  Melvyn Novas, M D  Referring Provider: Catha Gosselin, MD Primary Care Physician:  Catha Gosselin, MD  Chief Complaint  Patient presents with  . New Patient (Initial Visit)    patient with caretaker, rm 54. pt presents stating that he has became bed bound over the last 3 mths    HPI:  Damon Colon is a 78 y.o. caucasian , wheelchair bound male , 04-02-2019, in a wheelchair.  He was seen here last 04-02-2014 in a revisit for spinal stenosis , myelopathy.  I believe our last visit was just 4 years ago.  His last EPIC were from ED Dr Juleen China. 11-2018,  1) Tell patient to stop taking cephalexin 2) Talk to patient or caregiver and ensure that patient has a PCP for immediate follow up. Patient is on warfarin (drug interaction with Bactrim) - must ensure that patient can contact clinic managing warfarin dosing TODAY for instruction for dose adjustment given antibiotic changes and that patient can get INR checked in the next 24 hours. Pt will need to make sure that warfarin clinic is aware of new oral antibiotic prescription for necessary warfarin dose adjustments. 3) IF patient is able to contact warfarin clinic today and able to have follow up with warfarin clinic for INR check within 24 hours; then call in prescription for Bactrim DS 1 tablet PO BID x 7 days with follow up with PCP this week. No refills.  4) If patient is unable to ensure immediate warfarin clinic follow up or/and if patient does not have PCP, will not call in a new prescription for Bactrim. In that case, will need to instruct patient to return to hospital for IV antibiotics. New antibiotic prescription: See above.  After this note in the ED, PCP Dr. Fredirick Maudlin office  for UTI  Being transport on a stretcher, had pro-time check in the ambulance. He never was examined, in August tele-health visit with Dr Clarene Duke he was told not to change to another anticoagulant - inspite of the need for  ambulance visits for labs, INR>  He has seen Urologist  In September , to follow up on indwelling cathether since July visit in hospital. Home cath or self cath cannot be done.  A lift chair  And wheelchair replacement have been requested but PCP has not filled the order form.   My role in this is patient is not to evaluate his bladder dysfunction or need for anticoagulation, but to see if his cervical spine stenosis has progressed or his postpolio- . He has been wheelchair dependent since 4 years ago- wheeled walker has not been usable, he has not transferred in over 2.5 years his care aid confirmed, left leg is non weight baring. I see little progression and much more deconditioning. Loss of apettite, looking gaunt. His sister nanny is his POA.  Until 2.5  years ago he was able to transfer a little bit- and fell, EMS had to come to the home several times 2017 and 2018.     HISTORY _ Has been a long-time established patient of practice. His first [facility] outpatient clinic visit was on 12-07-1998 with Dr Sandria Manly . 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 He was able to use a crutch  to support his right leg - but his left leg still  needed braces for years thereafter. Four over three decades he was able to walk without a cane or crutch but had a number of falls. Unfortunately, on 11-28-1998 , at night he fell down a flight of stairs but his left leg gave way. He injured his right elbow and was seen at the rest in the hospital emergency room. Initially he complained of a scratchy sensation as if sand paper covered his fingers in both hands,  which subsequently slowly improved . Than he be begun developing neck stiffness bowel and bladder incontinence were not an initial complaint but the electric shock sensation whenever her bending and flexing at the neck. In  2006 or 2007 he had another fall in his kitchen -he went to [facility] home for rehab.   It was soon clear that Mr. 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 He had been unable to move but was not in pain after his fall. He has progressed to wheelchair dependence.  Mr. Schueler has lost dexterity in both hands and therefore is not able to operate a pen, his handwriting therefore was deferred.  He is not a tobacco user he is not drinking alcohol and is not using recreational drugs. Mr. Kozakiewicz had no recent change in his visual acuity it is not all the last 12 months, his respirations have been regular he does not have talking but occasionally a postnasal drip. No palpitations no gastrointestinal problems . He is no longer adhering to external circadian structure and likes to sleep in his recliner.    04-02-14 : The patient did not participate in PT, as he felt better. Today, we discuss the possibility of occupational therapy and heated wax therapy.    Review of Systems: Out of a complete 14 system review, the patient complains of only the following symptoms, and all other reviewed systems are negative.  Wheelchair bound for over 4 years, has lost muscle mass, but is deconditioned.   Last time he walked, 4/ 2007 buy his own account.    Social History   Socioeconomic History  . Marital status: Single    Spouse name: Not on file  . Number of children: 0  . Years of education: College  . Highest education level: Not on file  Occupational History  . Not on file  Social Needs  . Financial resource strain: Not on file  . Food insecurity    Worry: Not on file    Inability: Not on file  . Transportation needs    Medical: Not on file    Non-medical: Not on file  Tobacco Use  . Smoking status: Former Games developer  . Smokeless tobacco: Never Used  . Tobacco comment: 1981   Substance and Sexual Activity  . Alcohol use: Yes    Comment: occa  . Drug use: No  . Sexual activity: Not on file  Lifestyle  . Physical activity    Days per week: Not on file    Minutes per session: Not on file  . Stress: Not on file  Relationships  . Social Musician on phone: Not on file    Gets together: Not on file    Attends religious service: Not on file    Active member of club or organization: Not on file    Attends meetings of clubs or organizations: Not on file    Relationship status: Not  on file  . Intimate partner violence    Fear of current or ex partner: Not on file    Emotionally abused: Not on file    Physically abused: Not on file    Forced sexual activity: Not on file  Other Topics Concern  . Not on file  Social History Narrative   Patient is single and lives with his sister.   Patient is retired.   Patient drinks a half cup to one cup of caffeine daily.   Patient is right-handed.   Patient has a college education.    No family history on file.  Past Medical History:  Diagnosis Date  . Arthritis   . Bruises easily   . Fever    102  . Hypertension   . 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 Polio   . 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 Wears glasses     Past Surgical History:  Procedure Laterality Date  . APPENDECTOMY    . COLONOSCOPY    . NECK SURGERY     c7- titanium plates put in  . SPINE SURGERY     3-stage spine fusion  . TONSILLECTOMY AND ADENOIDECTOMY    . URETHRAL DIVERTICULECTOMY      Current Outpatient Medications  Medication Sig Dispense Refill  . acetaminophen (TYLENOL) 500 MG tablet Take 500 mg by mouth every 6 (six) hours as needed for mild pain, moderate pain or headache.     . cephALEXin (KEFLEX) 500 MG capsule Take 1 capsule (500 mg total) by mouth 3 (three) times daily. 20 capsule 0  . finasteride (PROSCAR) 5 MG tablet Take 5 mg by mouth Daily.     . Multiple Vitamin (MULTIVITAMIN WITH MINERALS) TABS tablet  Take 1 tablet by mouth daily.    . polyethylene glycol (MIRALAX / GLYCOLAX) 17 g packet Take 17 g by mouth daily as needed for mild constipation.    . pravastatin (PRAVACHOL) 80 MG tablet Take 80 mg by mouth daily.     . traMADol (ULTRAM) 50 MG tablet Take 50 mg by mouth 3 (three) times daily.     Marland Kitchen warfarin (COUMADIN) 3 MG tablet Take 3 mg by mouth every evening.      No current facility-administered medications for this visit.     Allergies as of 04/02/2019 - Review Complete 04/02/2019  Allergen Reaction Noted  . Codeine Other (See Comments) 12/16/2010    Vitals: BP 116/75   Pulse 88   Temp 98.2 F (36.8 C)  Last Weight:  Wt Readings from Last 1 Encounters:  05/07/16 138 lb (62.6 kg)   Last Height:   Ht Readings from Last 1 Encounters:  05/07/16 5\' 4"  (1.626 m)    Physical exam:  General: The patient is awake, alert and appears not in acute distress. The patient is well groomed. Head: Normocephalic, atraumatic. Neck is supple. Mallampati 3 , neck circumference: 16 , Cardiovascular:  Regular rate and rhythm, without  murmurs or carotid bruit, and without distended neck veins. Respiratory: Lungs are clear to auscultation. Skin:  Without evidence of edema, or rash Trunk: Slender,  Palpable atrophy , weakness of the paraspinal muscles. Scoliosis to the left.  Reports : Right shoulder pain,  left hand thumb in extended position.  Neurologic exam : The patient is awake and alert, oriented to place and time.   Memory subjective described as intact.  There is a normal attention span & concentration ability.  Speech is fluent without  dysarthria, dysphonia or aphasia.  Mood and affect are appropriate.  Cranial nerves: Pupils are equal and briskly reactive to light.   Extraocular movements  in vertical and horizontal planes intact  without nystagmus.  Hearing to finger rub intact.  Facial sensation intact to fine touch.  Facial motor strength is symmetric and tongue and  uvula move midline.  Motor exam:  Atrophy of muscles in all 4 extremities. Hand atrophy.  Paraspinal muscles atrophied, too-  allowing the scoliosis at the thoracic level to the left and cervical lordosis. He has a lot of shoulder pain, when leaning onto th wheelchairs back or siitting.  He had chronic leg swelling.  Has lost all muscles in his lower extremities.  Pain with passive ROM in the medial knee right leg, and in the hip left leg.  No movement- foot drop with atrophy, no active knee flexion, hip flexion a or adduction.   This is post consistent with post polio.   He has postpolio in addition to cervical spinal cord compression .  Coordination: Rapid alternating movements in the fingers is not longer possible. He lost interdigital muscles,  SENSORY LOSS IN ALL 10 fingers. Left leg, abdomen.   Incontinence to urine and not to stool.  Gait and station: Patient is wheelchair bound.  Assessment:  After physical and neurologic examination, review of laboratory studies, imaging, neurophysiology testing and pre-existing records, assessment is  1) spinal stenosis, cervical myelopathy and post polio syndrome.    Plan:  Treatment plan and additional workup : 1)  [facility] Mal[facility]Foster [facility] -  Protime and Foley changes. I think he can possibly resume in house PT. How mauch he can be mobilized or passively moved. I would like him to see occupational therapy for the finger deformity .   Home-health care RN assessment for PT/ OT and if deemed necessary also ST - swallowing.  This [place] , [place] [facility] [place] [facility] but needs in his current state in home PT.   MRI cervical spine ordered. Post polio syndrome versus cervical stenosis progression.  High protein diet.    Question to PCP :  carotid artery duplex has been done yearly- why?  can we change away from coumadin?  He should be able to get a suprapubic cathether ?  Please address with prescriber.     Melvyn Novasarmen  Mishawn Hemann, MD 04-02-2019  [facility]

## 2019-04-02 NOTE — Patient Instructions (Signed)
Postpolio Syndrome Postpolio syndrome is a condition that develops 15 years or longer after recovery from polio. Symptoms include muscle weakness, pain, and fatigue. Postpolio syndrome is not as dangerous as polio, but it can be severe enough to affect your quality of life. There is no way to prevent it from developing. What are the causes? The cause of this condition is not known. It may be caused by brain damage from a viral infection or overworked nerve cells in the brain and spinal cord. What increases the risk? You are more likely to develop this condition if you had severe polio or if you are a woman who had polio. What are the signs or symptoms? Signs and symptoms of postpolio syndrome can start 15 years or longer after recovery from polio. Symptoms start gradually and can vary greatly from person to person. They may include:  Weakness in muscles, especially those that were previously affected by polio.  Difficulty with: ? Walking. ? Breathing. ? Swallowing. ? Standing up straight.  Fatigue.  Muscle aches.  Joint pain.  Shrinking of muscles (atrophy).  Loud snoring and short periods of interrupted breathing at night (sleep apnea).  Difficulty tolerating cold. How is this diagnosed? This condition is diagnosed based on your medical history and a physical exam. You may be diagnosed with postpolio syndrome if:  You had partial or complete recovery from polio 15 or more years ago.  You have new symptoms of muscle weakness or fatigue that gradually get worse and do not go away.  You have symptoms of postpolio syndrome for at least one year.  You have no other explanations for your symptoms. Tests may be done to rule out other problems that could be causing your symptoms. These tests may include:  CT scans.  MRI scans.  Removal of a sample of muscle tissue (biopsy) to be examined under a microscope.  Removal of a sample of spinal fluid to be examined under a microscope  (lumbar puncture). How is this treated? There is no cure for this condition, but treatment can help to manage your symptoms and strengthen your muscles without wearing them out. Treatment may include:  Avoiding activities that cause severe muscle fatigue. This includes activities that cause muscle pain or fatigue that lasts longer than 10 minutes.  Using devices to help you move around (mobility aids), such as walkers, canes, or scooters.  Using ventilation equipment to help with breathing problems.  Moderate-intensity exercise, such as swimming.  Taking anti-inflammatory medicines, such as aspirin, to help with pain.  Working with a team of rehabilitation therapists. This can include: ? A physical therapist, who can help you develop and maintain the right exercise routine. ? A speech therapist, who can help you with swallowing problems. ? An occupational therapist, who can help to adjust your home environment to make activities easier for you. Follow these instructions at home: Activity  Pace yourself to save energy during the day. ? Break down big tasks into smaller tasks. ? Rest between tasks. ? Use mobility aids to save energy.  Exercise as directed by your health care provider or rehabilitation therapists. ? Exercise enough to strengthen your muscles but not to tire them. ? Do not do any types of exercise that cause weakness, fatigue, or aching. This includes activities that cause muscle pain or fatigue that lasts longer than 10 minutes. Lifestyle  Eat a healthy diet that includes plenty of vegetables, fruits, and whole grains.  Maintain a healthy weight. If you are overweight,   ask your health care provider to help you with a weight loss program.  Get plenty of rest. Aim for 7-9 hours of sleep every night.  Do not use any products that contain nicotine or tobacco, such as cigarettes and e-cigarettes. If you need help quitting, ask your health care provider. General  instructions  Work closely with your health care providers and rehabilitation therapists. If you are struggling at home, ask your health care provider about services that may be available to help you.  Take over-the-counter and prescription medicines only as told by your health care provider. Make sure you know what side effects to watch for and when to call your health care provider.  Have a good support system at home. Ask friends and family to help you with everyday tasks.  Keep all follow-up visits as told by your health care provider. This is important. Contact a health care provider if:  You develop new symptoms.  Your symptoms get worse.  You are coughing and choking during or after meals.  You need more support at home. Get help right away if:  You can no longer care for yourself safely at home.  You are having trouble breathing.  You are unable to swallow.  You have chest pain. Summary  Postpolio syndrome is a condition that develops 15 years or longer after recovery from polio. It is not as dangerous as polio, but it can be severe enough to interfere with your quality of life.  You are more likely to develop this condition if you had severe polio or if you are a woman who had polio.  There is no cure for this condition, but treatment can help manage your symptoms and strengthen your muscles without wearing them out.  Work closely with your health care providers and rehabilitation therapists. If you are struggling at home, ask your health care provider about services that may be available to help you.  You should not do activities that cause muscle pain or fatigue that lasts more than 10 minutes. Your health care provider will work with you to use exercises, techniques, and mobility aids (such as a walker, cane, or scooter) to avoid muscle fatigue. This information is not intended to replace advice given to you by your health care provider. Make sure you discuss any  questions you have with your health care provider. Document Released: 05/05/2002 Document Revised: 04/27/2017 Document Reviewed: 05/01/2016 Elsevier Patient Education  2020 Elsevier Inc.  

## 2019-04-03 ENCOUNTER — Ambulatory Visit (HOSPITAL_COMMUNITY): Payer: Medicare Other

## 2019-04-03 ENCOUNTER — Other Ambulatory Visit: Payer: Self-pay | Admitting: Neurology

## 2019-04-03 ENCOUNTER — Telehealth: Payer: Self-pay | Admitting: Neurology

## 2019-04-03 DIAGNOSIS — S14109D Unspecified injury at unspecified level of cervical spinal cord, subsequent encounter: Secondary | ICD-10-CM

## 2019-04-03 DIAGNOSIS — G8250 Quadriplegia, unspecified: Secondary | ICD-10-CM

## 2019-04-03 DIAGNOSIS — G14 Postpolio syndrome: Secondary | ICD-10-CM

## 2019-04-03 NOTE — Telephone Encounter (Signed)
[employer] medicare no auth patient is scheduled at [facility] for 04/10/19 arrival time is 12:30 pm. Patient sister is aware of time and day.

## 2019-04-03 NOTE — Telephone Encounter (Signed)
I also gave her their number of [phone] incase he needed to r/s.

## 2019-04-03 NOTE — Telephone Encounter (Signed)
Order placed and corrected.

## 2019-04-03 NOTE — Telephone Encounter (Signed)
error 

## 2019-04-03 NOTE — Telephone Encounter (Signed)
Since patient is wheelchair bond they have a lift at [facility]. When you get a chance can you put a new order in for [facility]. Thank you!

## 2019-04-10 ENCOUNTER — Ambulatory Visit (HOSPITAL_COMMUNITY)
Admission: RE | Admit: 2019-04-10 | Discharge: 2019-04-10 | Disposition: A | Discharge status: Home / Self Care | Payer: Medicare Other | Source: Ambulatory Visit | Attending: Neurology | Admitting: Neurology

## 2019-04-10 ENCOUNTER — Other Ambulatory Visit: Payer: Self-pay | Admitting: Internal Medicine

## 2019-04-10 ENCOUNTER — Other Ambulatory Visit: Payer: Self-pay

## 2019-04-10 DIAGNOSIS — S14109D Unspecified injury at unspecified level of cervical spinal cord, subsequent encounter: Secondary | ICD-10-CM | POA: Diagnosis present

## 2019-04-10 DIAGNOSIS — G8250 Quadriplegia, unspecified: Secondary | ICD-10-CM | POA: Diagnosis present

## 2019-04-10 DIAGNOSIS — G14 Postpolio syndrome: Secondary | ICD-10-CM | POA: Insufficient documentation

## 2019-04-10 LAB — POCT I-STAT CREATININE: Creatinine, Ser: 0.4 mg/dL — ABNORMAL LOW (ref 0.61–1.24)

## 2019-04-10 MED ORDER — GADOBUTROL 1 MMOL/ML IV SOLN
6.0000 mL | Freq: Once | INTRAVENOUS | Status: AC | PRN
  Administered 2019-04-10: 6 mL via INTRAVENOUS

## 2019-04-15 ENCOUNTER — Telehealth: Payer: Self-pay | Admitting: Neurology

## 2019-04-15 NOTE — Telephone Encounter (Signed)
-----   Message from Carmen Dohmeier, MD sent at 04/14/2019  4:23 PM EST ----- There is no surgical intervention, only epidural pain treatment ( if in pain )  and PT. His main symptoms are due to postpolio.  I like to encourage some PT in the home. 

## 2019-04-15 NOTE — Telephone Encounter (Signed)
Called and someone answered the phone and pt was not available. She asked for a call back later. Advised I would.

## 2019-04-16 ENCOUNTER — Telehealth: Payer: Self-pay | Admitting: Neurology

## 2019-04-16 ENCOUNTER — Other Ambulatory Visit: Payer: Medicare Other | Admitting: Internal Medicine

## 2019-04-16 ENCOUNTER — Other Ambulatory Visit: Payer: Self-pay

## 2019-04-16 DIAGNOSIS — S14109D Unspecified injury at unspecified level of cervical spinal cord, subsequent encounter: Secondary | ICD-10-CM

## 2019-04-16 DIAGNOSIS — G14 Postpolio syndrome: Secondary | ICD-10-CM

## 2019-04-16 NOTE — Telephone Encounter (Signed)
Called and reviewed the MRI res

## 2019-04-16 NOTE — Addendum Note (Signed)
Addended by: Darleen Crocker on: 04/16/2019 03:58 PM   Modules accepted: Orders

## 2019-04-16 NOTE — Telephone Encounter (Signed)
Called and reviewed the Lab results and MRI results with the patient. Informed him that MRI didn't appear to show impingement of nerves. Informed that Dr Brett Fairy would recommend home health PT to help with his discomfort. I will place a order for the patient as he has agreed to have hh therapy. Order placed

## 2019-04-16 NOTE — Telephone Encounter (Signed)
-----   Message from Larey Seat, MD sent at 04/14/2019  4:23 PM EST ----- There is no surgical intervention, only epidural pain treatment ( if in pain )  and PT. His main symptoms are due to postpolio.  I like to encourage some PT in the home.

## 2019-05-06 ENCOUNTER — Other Ambulatory Visit (HOSPITAL_COMMUNITY): Payer: Self-pay | Admitting: Family Medicine

## 2019-05-06 DIAGNOSIS — I6523 Occlusion and stenosis of bilateral carotid arteries: Secondary | ICD-10-CM

## 2019-05-08 ENCOUNTER — Other Ambulatory Visit: Payer: Self-pay

## 2019-05-08 ENCOUNTER — Ambulatory Visit (HOSPITAL_COMMUNITY)
Admission: RE | Admit: 2019-05-08 | Discharge: 2019-05-08 | Disposition: A | Discharge status: Home / Self Care | Payer: Medicare Other | Source: Ambulatory Visit | Attending: Internal Medicine | Admitting: Internal Medicine

## 2019-05-08 DIAGNOSIS — I6523 Occlusion and stenosis of bilateral carotid arteries: Secondary | ICD-10-CM | POA: Diagnosis not present

## 2019-07-01 ENCOUNTER — Other Ambulatory Visit (HOSPITAL_COMMUNITY): Payer: Self-pay | Admitting: Urology

## 2019-07-01 DIAGNOSIS — R339 Retention of urine, unspecified: Secondary | ICD-10-CM

## 2019-07-15 ENCOUNTER — Emergency Department (HOSPITAL_COMMUNITY)
Admission: EM | Admit: 2019-07-15 | Discharge: 2019-07-16 | Disposition: A | Discharge status: Home / Self Care | Payer: Medicare Other | Attending: Emergency Medicine | Admitting: Emergency Medicine

## 2019-07-15 ENCOUNTER — Encounter (HOSPITAL_COMMUNITY): Payer: Self-pay

## 2019-07-15 ENCOUNTER — Other Ambulatory Visit: Payer: Self-pay

## 2019-07-15 ENCOUNTER — Emergency Department (HOSPITAL_COMMUNITY): Payer: Medicare Other

## 2019-07-15 DIAGNOSIS — Z87891 Personal history of nicotine dependence: Secondary | ICD-10-CM | POA: Diagnosis not present

## 2019-07-15 DIAGNOSIS — R509 Fever, unspecified: Secondary | ICD-10-CM | POA: Diagnosis not present

## 2019-07-15 DIAGNOSIS — R531 Weakness: Secondary | ICD-10-CM | POA: Diagnosis present

## 2019-07-15 DIAGNOSIS — R1030 Lower abdominal pain, unspecified: Secondary | ICD-10-CM

## 2019-07-15 DIAGNOSIS — Z79899 Other long term (current) drug therapy: Secondary | ICD-10-CM | POA: Diagnosis not present

## 2019-07-15 DIAGNOSIS — Z20822 Contact with and (suspected) exposure to covid-19: Secondary | ICD-10-CM | POA: Insufficient documentation

## 2019-07-15 DIAGNOSIS — K5909 Other constipation: Secondary | ICD-10-CM

## 2019-07-15 LAB — CBC WITH DIFFERENTIAL/PLATELET
Abs Immature Granulocytes: 0.04 10*3/uL (ref 0.00–0.07)
Basophils Absolute: 0.1 10*3/uL (ref 0.0–0.1)
Basophils Relative: 0 %
Eosinophils Absolute: 0.1 10*3/uL (ref 0.0–0.5)
Eosinophils Relative: 0 %
HCT: 37.4 % — ABNORMAL LOW (ref 39.0–52.0)
Hemoglobin: 11.9 g/dL — ABNORMAL LOW (ref 13.0–17.0)
Immature Granulocytes: 0 %
Lymphocytes Relative: 22 %
Lymphs Abs: 3.2 10*3/uL (ref 0.7–4.0)
MCH: 29.8 pg (ref 26.0–34.0)
MCHC: 31.8 g/dL (ref 30.0–36.0)
MCV: 93.7 fL (ref 80.0–100.0)
Monocytes Absolute: 1.1 10*3/uL — ABNORMAL HIGH (ref 0.1–1.0)
Monocytes Relative: 8 %
Neutro Abs: 10.1 10*3/uL — ABNORMAL HIGH (ref 1.7–7.7)
Neutrophils Relative %: 70 %
Platelets: 376 10*3/uL (ref 150–400)
RBC: 3.99 MIL/uL — ABNORMAL LOW (ref 4.22–5.81)
RDW: 14.2 % (ref 11.5–15.5)
WBC: 14.5 10*3/uL — ABNORMAL HIGH (ref 4.0–10.5)
nRBC: 0 % (ref 0.0–0.2)

## 2019-07-15 LAB — COMPREHENSIVE METABOLIC PANEL
ALT: 14 U/L (ref 0–44)
AST: 27 U/L (ref 15–41)
Albumin: 3.3 g/dL — ABNORMAL LOW (ref 3.5–5.0)
Alkaline Phosphatase: 58 U/L (ref 38–126)
Anion gap: 9 (ref 5–15)
BUN: 17 mg/dL (ref 8–23)
CO2: 28 mmol/L (ref 22–32)
Calcium: 9.5 mg/dL (ref 8.9–10.3)
Chloride: 97 mmol/L — ABNORMAL LOW (ref 98–111)
Creatinine, Ser: 0.38 mg/dL — ABNORMAL LOW (ref 0.61–1.24)
GFR calc Af Amer: >60 mL/min (ref >60)
GFR calc non Af Amer: >60 mL/min (ref >60)
Glucose, Bld: 138 mg/dL — ABNORMAL HIGH (ref 70–99)
Potassium: 4.3 mmol/L (ref 3.5–5.1)
Sodium: 134 mmol/L — ABNORMAL LOW (ref 135–145)
Total Bilirubin: 0.6 mg/dL (ref 0.3–1.2)
Total Protein: 7.5 g/dL (ref 6.5–8.1)

## 2019-07-15 LAB — PROTIME-INR
INR: 3.5 — ABNORMAL HIGH (ref 0.8–1.2)
Prothrombin Time: 35.4 seconds — ABNORMAL HIGH (ref 11.4–15.2)

## 2019-07-15 LAB — URINALYSIS, ROUTINE W REFLEX MICROSCOPIC
Bacteria, UA: NONE SEEN
Bilirubin Urine: NEGATIVE
Glucose, UA: NEGATIVE mg/dL
Hgb urine dipstick: NEGATIVE
Ketones, ur: 5 mg/dL — AB
Nitrite: NEGATIVE
Protein, ur: >=300 mg/dL — AB
Specific Gravity, Urine: 1.02 (ref 1.005–1.030)
WBC, UA: >50 WBC/hpf — ABNORMAL HIGH (ref 0–5)
pH: 6 (ref 5.0–8.0)

## 2019-07-15 LAB — LACTIC ACID, PLASMA: Lactic Acid, Venous: 1.2 mmol/L (ref 0.5–1.9)

## 2019-07-15 LAB — APTT: aPTT: 39 seconds — ABNORMAL HIGH (ref 24–36)

## 2019-07-15 MED ORDER — SODIUM CHLORIDE 0.9 % IV SOLN
2.0000 g | Freq: Once | INTRAVENOUS | Status: AC
  Administered 2019-07-15: 2 g via INTRAVENOUS
  Filled 2019-07-15: qty 2

## 2019-07-15 MED ORDER — SODIUM CHLORIDE 0.9 % IV BOLUS
30.0000 mL/kg | Freq: Once | INTRAVENOUS | Status: AC
  Administered 2019-07-15: 1836 mL via INTRAVENOUS

## 2019-07-15 NOTE — ED Triage Notes (Signed)
Fever last two days, generalized weakness, cough, has not taken meds for the past two days neither. Has hx of recurrent uti, has chronic foley with cloudy urine, decreased appetite.

## 2019-07-15 NOTE — ED Provider Notes (Signed)
[facility]-EMERGENCY DEPT Provider Note   CSN: [id number] Arrival date & time: 07/15/19  2002     History Chief Complaint  Patient presents with  . Recurrent UTI  . Weakness    Damon Colon is a 79 y.o. male.  The history is provided by the patient, medical records and a caregiver. No language interpreter was used.  Weakness  Damon Colon is a 79 y.o. male who presents to the Emergency Department complaining of fever. He complains of low-grade fever for 1-2 days, increased to about 100 today.   He has a mild cough for the last 24 hours.  He was nauseated this morning, no vomiting.  He had one episode of diarrhea this morning. He has a chronic Foley catheter. Symptoms are mild to moderate in nature.  He lives with a 24 hour caregiver, Damon Colon.      Past Medical History:  Diagnosis Date  . Arthritis   . Bruises easily   . Fever    102  . Hypertension   . Paraplegia (HCC)   . Polio   . 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 Wears glasses     Patient Active Problem List   Diagnosis Date Noted  . Neurogenic bladder disorder 04/02/2014  . 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 APPENDECTOMY    . COLONOSCOPY    . NECK SURGERY     c7- titanium plates put in  . SPINE SURGERY     3-stage spine fusion  . TONSILLECTOMY AND ADENOIDECTOMY    . URETHRAL DIVERTICULECTOMY         History reviewed. No pertinent family history.  Social History   Tobacco Use  . Smoking status: Former Games developer  . Smokeless tobacco: Never Used  . Tobacco comment: 1981  Substance Use Topics  . Alcohol use: Yes    Comment: occa  . Drug use: No    Home Medications Prior to Admission medications   Medication Sig Start Date End Date Taking? Authorizing Provider  acetaminophen (TYLENOL)  500 MG tablet Take 500 mg by mouth every 6 (six) hours as needed for mild pain, moderate pain or headache.    Yes [provider]  gabapentin (NEURONTIN) 300 MG capsule Take 300 mg by mouth at bedtime as needed (pain).  04/17/19  Yes [provider]  Multiple Vitamin (MULTIVITAMIN WITH MINERALS) TABS tablet Take 1 tablet by mouth daily.   Yes [provider]  oxybutynin (DITROPAN) 5 MG tablet Take 5 mg by mouth daily.  06/25/19  Yes [provider]  polyethylene glycol (MIRALAX / GLYCOLAX) 17 g packet Take 17 g by mouth daily as needed for mild constipation.   Yes [provider]  pravastatin (PRAVACHOL) 80 MG tablet Take 80 mg by mouth daily.  10/05/18  Yes [provider]  traMADol (ULTRAM) 50 MG tablet Take 50 mg by mouth 2 (two) times daily as needed (for pain).    Yes [provider]  warfarin (COUMADIN) 3 MG tablet Take 3-4.5 mg by mouth See admin instructions. Take 1 & 1/2 (4.5 MG) On Tuesday & Friday and take 1 tablet (3 MG) on Sunday, Monday, Wednesday, Thursday, Saturday   Yes [provider]  cephALEXin (KEFLEX) 500 MG capsule Take 1 capsule (500 mg total) by mouth 3 (three) times daily. Patient not taking:  Reported on 07/15/2019 02/06/19   Virgel Manifold, MD    Allergies    Codeine  Review of Systems   Review of Systems  Neurological: Positive for weakness.  All other systems reviewed and are negative.   Physical Exam Updated Vital Signs BP 134/76   Pulse 91   Temp 100.2 F (37.9 C) (Rectal)   Resp 13   Ht 5\' 4"  (1.626 m)   Wt 61.2 kg   SpO2 98%   BMI 23.17 kg/m   Physical Exam Vitals and nursing note reviewed.  Constitutional:      Appearance: He is well-developed.  HENT:     Head: Normocephalic and atraumatic.  Cardiovascular:     Rate and Rhythm: Normal rate and regular rhythm.     Heart sounds: No murmur.  Pulmonary:     Effort: Pulmonary effort is normal. No respiratory distress.      Breath sounds: Normal breath sounds.  Abdominal:     Palpations: Abdomen is soft.     Tenderness: There is no guarding or rebound.     Comments: Mild lower abdominal tenderness  Genitourinary:    Comments: 16 french foley catheter in place with scant cloudy urine in the bag.   Musculoskeletal:        General: No swelling or tenderness.  Skin:    General: Skin is warm and dry.  Neurological:     Mental Status: He is alert and oriented to person, place, and time.     Comments: Weakness of BUE, paralysis to BLE  Psychiatric:        Behavior: Behavior normal.     ED Results / Procedures / Treatments   Labs (all labs ordered are listed, but only abnormal results are displayed) Labs Reviewed  COMPREHENSIVE METABOLIC PANEL - Abnormal; Notable for the following components:      Result Value   Sodium 134 (*)    Chloride 97 (*)    Glucose, Bld 138 (*)    Creatinine, Ser 0.38 (*)    Albumin 3.3 (*)    All other components within normal limits  CBC WITH DIFFERENTIAL/PLATELET - Abnormal; Notable for the following components:   WBC 14.5 (*)    RBC 3.99 (*)    Hemoglobin 11.9 (*)    HCT 37.4 (*)    Neutro Abs 10.1 (*)    Monocytes Absolute 1.1 (*)    All other components within normal limits  APTT - Abnormal; Notable for the following components:   aPTT 39 (*)    All other components within normal limits  PROTIME-INR - Abnormal; Notable for the following components:   Prothrombin Time 35.4 (*)    INR 3.5 (*)    All other components within normal limits  URINALYSIS, ROUTINE W REFLEX MICROSCOPIC - Abnormal; Notable for the following components:   APPearance TURBID (*)    Ketones, ur 5 (*)    Protein, ur >=300 (*)    Leukocytes,Ua MODERATE (*)    WBC, UA >50 (*)    All other components within normal limits  CULTURE, BLOOD (ROUTINE X 2)  CULTURE, BLOOD (ROUTINE X 2)  URINE CULTURE  SARS CORONAVIRUS 2 (TAT 6-24 HRS)  LACTIC ACID, PLASMA  LACTIC ACID, PLASMA  POC SARS  CORONAVIRUS 2 AG -  ED    EKG EKG Interpretation  Date/Time:  Tuesday July 15 2019 21:17:19 EST Ventricular Rate:  89 PR Interval:    QRS Duration: 122 QT Interval:  376 QTC Calculation: 458 R  Axis:   66 Text Interpretation: Sinus rhythm Right bundle branch block Interpretation limited secondary to artifact Confirmed by Tilden Fossa [phone]) on 07/15/2019 9:19:59 PM   Radiology DG Chest Port 1 View  Result Date: 07/15/2019 CLINICAL DATA:  Fever and cough for 2 days. EXAM: PORTABLE CHEST 1 VIEW COMPARISON:  12/06/2018 FINDINGS: The heart size and mediastinal contours are within normal limits. Aortic atherosclerosis incidentally noted. Both lungs are clear. Thoracic dextroscoliosis again noted, as well as cervical spine fusion hardware. IMPRESSION: No active disease. Electronically Signed   By: Danae Orleans M.D.   On: 07/15/2019 21:32    Procedures Procedures (including critical care time)  Medications Ordered in ED Medications  sodium chloride (PF) 0.9 % injection (has no administration in time range)  ceFEPIme (MAXIPIME) 2 g in sodium chloride 0.9 % 100 mL IVPB (0 g Intravenous Stopped 07/15/19 2245)  sodium chloride 0.9 % bolus 1,836 mL (1,836 mLs Intravenous New Bag/Given 07/15/19 2333)  iohexol (OMNIPAQUE) 300 MG/ML solution 100 mL (100 mLs Intravenous Contrast Given 07/16/19 0041)    ED Course  I have reviewed the triage vital signs and the nursing notes.  Pertinent labs & imaging results that were available during my care of the patient were reviewed by me and considered in my medical decision making (see chart for details).    MDM Rules/Calculators/A&P                      Patient with chronic Foley catheter, post polio syndrome with paralysis here for evaluation of fever, one episode of nausea and diarrhea as well as mild cough. He is non-toxic appearing on evaluation. Initial concern for UTI and he was treated with antibiotics. He did have very little urine output,  likely secondary to dehydration - treated with IV fluid hydration. Urinalysis is not consistent with UTI. Lactate is within normal limits. He does have some lower abdominal tenderness on examination. Will check CT abdomen pelvis to rule out intra-abdominal infection. Patient care transferred pending imaging.    Additional history available from the patient's sister, Damon Colon after his initial ED arrival. She was contacted at [phone]. She reports a low-grade temperature is at home for the last few days, temperature up to 100 today with patient eating less and having less energy than usual. No known COVID 19 exposures. Final Clinical Impression(s) / ED Diagnoses Final diagnoses:  Lower abdominal pain    Rx / DC Orders ED Discharge Orders    None       Tilden Fossa, MD 07/16/19 [phone]

## 2019-07-15 NOTE — Progress Notes (Signed)
A consult was received from an ED physician for cefepime per pharmacy dosing (for an indication other than meningitis). The patient's profile has been reviewed for ht/wt/allergies/indication/available labs. A one time order has been placed for the above antibiotics.  Further antibiotics/pharmacy consults should be ordered by admitting physician if indicated.                       Bernadene Person, PharmD, BCPS [phone] 07/15/2019, 9:21 PM

## 2019-07-15 NOTE — ED Notes (Signed)
Attempted to obtain urine sample, but there was not enough urine in drainage tube.

## 2019-07-16 ENCOUNTER — Encounter (HOSPITAL_COMMUNITY): Payer: Self-pay

## 2019-07-16 ENCOUNTER — Emergency Department (HOSPITAL_COMMUNITY): Payer: Medicare Other

## 2019-07-16 LAB — SARS CORONAVIRUS 2 (TAT 6-24 HRS): SARS Coronavirus 2: NEGATIVE

## 2019-07-16 MED ORDER — SODIUM CHLORIDE (PF) 0.9 % IJ SOLN
INTRAMUSCULAR | Status: AC
  Filled 2019-07-16: qty 50

## 2019-07-16 MED ORDER — IOHEXOL 300 MG/ML  SOLN
100.0000 mL | Freq: Once | INTRAMUSCULAR | Status: AC | PRN
  Administered 2019-07-16: 100 mL via INTRAVENOUS

## 2019-07-16 MED ORDER — POLYETHYLENE GLYCOL 3350 17 GM/SCOOP PO POWD: ORAL | 0 refills | Status: AC

## 2019-07-16 MED ORDER — MINERAL OIL RE ENEM: 1.0000 | ENEMA | Freq: Once | RECTAL | 0 refills | Status: AC

## 2019-07-16 NOTE — Progress Notes (Signed)
Ptar contacted. 

## 2019-07-16 NOTE — ED Notes (Signed)
Pt placed on 2L of oxygen via nasal cannula due to pt dropping to 62% on room air while sleeping.

## 2019-07-16 NOTE — ED Provider Notes (Signed)
I assumed care of this patient from Dr. Madilyn Hook.  Please see their note for further details of Hx, PE.  Briefly patient is a 79 y.o. male who presented lower abd pain pending CT.   CT reassuring, notable for constipation. Discussed with sister.  The patient appears reasonably screened and/or stabilized for discharge and I doubt any other medical condition or other [facility] requiring further screening, evaluation, or treatment in the ED at this time prior to discharge. Safe for discharge with strict return precautions.  Disposition: Discharge  Condition: Good  I have discussed the results, Dx and Tx plan with the patient/family who expressed understanding and agree(s) with the plan. Discharge instructions discussed at length. The patient/family was given strict return precautions who verbalized understanding of the instructions. No further questions at time of discharge.    ED Discharge Orders         Ordered    polyethylene glycol powder (MIRALAX) 17 GM/SCOOP powder     07/16/19 0234    mineral oil enema   Once     07/16/19 0234           Follow Up: Catha Gosselin, MD [address] [place] [address] [phone]  Schedule an appointment as soon as possible for a visit           Jak Haggar, Amadeo Garnet, MD 07/16/19 [phone]

## 2019-07-18 LAB — URINE CULTURE

## 2019-07-20 LAB — CULTURE, BLOOD (ROUTINE X 2)
Culture: NO GROWTH
Culture: NO GROWTH
Special Requests: ADEQUATE
Special Requests: ADEQUATE

## 2019-07-21 ENCOUNTER — Telehealth: Payer: Self-pay | Admitting: Internal Medicine

## 2019-07-21 NOTE — Telephone Encounter (Signed)
Rec'd message that patient's sister Harriett Sine had called late Friday evening.  Returned sister's call, went straight to voicemail.  Left message with my name and contact number.

## 2019-07-23 ENCOUNTER — Other Ambulatory Visit: Payer: Self-pay | Admitting: Radiology

## 2019-07-24 ENCOUNTER — Encounter (HOSPITAL_COMMUNITY): Payer: Self-pay

## 2019-07-24 ENCOUNTER — Ambulatory Visit (HOSPITAL_COMMUNITY)
Admission: RE | Admit: 2019-07-24 | Discharge: 2019-07-24 | Disposition: A | Discharge status: Home / Self Care | Payer: Medicare Other | Source: Ambulatory Visit | Attending: Urology | Admitting: Urology

## 2019-07-24 ENCOUNTER — Other Ambulatory Visit: Payer: Self-pay

## 2019-07-24 DIAGNOSIS — Z87891 Personal history of nicotine dependence: Secondary | ICD-10-CM | POA: Diagnosis not present

## 2019-07-24 DIAGNOSIS — R339 Retention of urine, unspecified: Secondary | ICD-10-CM | POA: Diagnosis not present

## 2019-07-24 DIAGNOSIS — I10 Essential (primary) hypertension: Secondary | ICD-10-CM | POA: Insufficient documentation

## 2019-07-24 DIAGNOSIS — G8250 Quadriplegia, unspecified: Secondary | ICD-10-CM | POA: Diagnosis not present

## 2019-07-24 DIAGNOSIS — N319 Neuromuscular dysfunction of bladder, unspecified: Secondary | ICD-10-CM | POA: Diagnosis present

## 2019-07-24 DIAGNOSIS — Z7901 Long term (current) use of anticoagulants: Secondary | ICD-10-CM | POA: Diagnosis not present

## 2019-07-24 DIAGNOSIS — Z87440 Personal history of urinary (tract) infections: Secondary | ICD-10-CM | POA: Insufficient documentation

## 2019-07-24 DIAGNOSIS — Z4682 Encounter for fitting and adjustment of non-vascular catheter: Secondary | ICD-10-CM | POA: Insufficient documentation

## 2019-07-24 DIAGNOSIS — Z79899 Other long term (current) drug therapy: Secondary | ICD-10-CM | POA: Diagnosis not present

## 2019-07-24 LAB — BASIC METABOLIC PANEL
Anion gap: 12 (ref 5–15)
BUN: 10 mg/dL (ref 8–23)
CO2: 25 mmol/L (ref 22–32)
Calcium: 9.1 mg/dL (ref 8.9–10.3)
Chloride: 98 mmol/L (ref 98–111)
Creatinine, Ser: <0.3 mg/dL — ABNORMAL LOW (ref 0.61–1.24)
Glucose, Bld: 70 mg/dL (ref 70–99)
Potassium: 4.1 mmol/L (ref 3.5–5.1)
Sodium: 135 mmol/L (ref 135–145)

## 2019-07-24 LAB — CBC WITH DIFFERENTIAL/PLATELET
Abs Immature Granulocytes: 0.08 10*3/uL — ABNORMAL HIGH (ref 0.00–0.07)
Basophils Absolute: 0.1 10*3/uL (ref 0.0–0.1)
Basophils Relative: 1 %
Eosinophils Absolute: 0.4 10*3/uL (ref 0.0–0.5)
Eosinophils Relative: 2 %
HCT: 35.6 % — ABNORMAL LOW (ref 39.0–52.0)
Hemoglobin: 11.4 g/dL — ABNORMAL LOW (ref 13.0–17.0)
Immature Granulocytes: 1 %
Lymphocytes Relative: 13 %
Lymphs Abs: 2.1 10*3/uL (ref 0.7–4.0)
MCH: 30.4 pg (ref 26.0–34.0)
MCHC: 32 g/dL (ref 30.0–36.0)
MCV: 94.9 fL (ref 80.0–100.0)
Monocytes Absolute: 1.2 10*3/uL — ABNORMAL HIGH (ref 0.1–1.0)
Monocytes Relative: 7 %
Neutro Abs: 12.3 10*3/uL — ABNORMAL HIGH (ref 1.7–7.7)
Neutrophils Relative %: 76 %
Platelets: 339 10*3/uL (ref 150–400)
RBC: 3.75 MIL/uL — ABNORMAL LOW (ref 4.22–5.81)
RDW: 15 % (ref 11.5–15.5)
WBC: 16.2 10*3/uL — ABNORMAL HIGH (ref 4.0–10.5)
nRBC: 0 % (ref 0.0–0.2)

## 2019-07-24 LAB — PROTIME-INR
INR: 1.1 (ref 0.8–1.2)
Prothrombin Time: 13.6 seconds (ref 11.4–15.2)

## 2019-07-24 MED ORDER — FLUMAZENIL 0.5 MG/5ML IV SOLN
INTRAVENOUS | Status: AC
  Filled 2019-07-24: qty 5

## 2019-07-24 MED ORDER — FENTANYL CITRATE (PF) 100 MCG/2ML IJ SOLN
INTRAMUSCULAR | Status: AC
  Filled 2019-07-24: qty 4

## 2019-07-24 MED ORDER — MIDAZOLAM HCL 2 MG/2ML IJ SOLN
INTRAMUSCULAR | Status: AC
  Filled 2019-07-24: qty 4

## 2019-07-24 MED ORDER — VANCOMYCIN HCL IN DEXTROSE 1-5 GM/200ML-% IV SOLN
INTRAVENOUS | Status: AC
  Administered 2019-07-24: 1000 mg via INTRAVENOUS
  Filled 2019-07-24: qty 200

## 2019-07-24 MED ORDER — MIDAZOLAM HCL 2 MG/2ML IJ SOLN
INTRAMUSCULAR | Status: AC | PRN
  Administered 2019-07-24 (×3): 0.5 mg via INTRAVENOUS

## 2019-07-24 MED ORDER — FENTANYL CITRATE (PF) 100 MCG/2ML IJ SOLN
INTRAMUSCULAR | Status: AC | PRN
  Administered 2019-07-24 (×4): 25 ug via INTRAVENOUS

## 2019-07-24 MED ORDER — SODIUM CHLORIDE 0.9 % IV SOLN: INTRAVENOUS | Status: DC

## 2019-07-24 MED ORDER — VANCOMYCIN HCL IN DEXTROSE 1-5 GM/200ML-% IV SOLN: 1000.0000 mg | INTRAVENOUS | Status: AC

## 2019-07-24 MED ORDER — LIDOCAINE HCL (PF) 1 % IJ SOLN
INTRAMUSCULAR | Status: AC | PRN
  Administered 2019-07-24: 10 mL via INTRADERMAL

## 2019-07-24 MED ORDER — NALOXONE HCL 0.4 MG/ML IJ SOLN
INTRAMUSCULAR | Status: AC
  Filled 2019-07-24: qty 1

## 2019-07-24 NOTE — Procedures (Signed)
Interventional Radiology Procedure Note  Procedure: CT guided suprapubic catheter placement  Complications: None  Estimated Blood Loss: < 10 mL  Findings: 12 Fr pigtail SPT placed into bladder lumen. Good return of urine into gravity bag. Foley catheter removed.  Jodi Marble. Fredia Sorrow, M.D Pager:  [phone]

## 2019-07-24 NOTE — Consult Note (Signed)
Chief Complaint: Patient was seen in consultation today for CT-guided suprapubic catheter placement  Referring Physician(s): Dahlstedt,Stephen  Supervising Physician: Irish Lack  Patient Status: [facility] - Out-pt  History of Present Illness: Damon Colon is a 79 y.o. 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  Past Medical History:  Diagnosis Date  . Arthritis   . Bruises easily   . Fever    102  . Hypertension   . Paraplegia (HCC)   . Polio   . 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 Wears glasses     Past Surgical History:  Procedure Laterality Date  . APPENDECTOMY    . COLONOSCOPY    . NECK SURGERY     c7- titanium plates put in  . SPINE SURGERY     3-stage spine fusion  . TONSILLECTOMY AND ADENOIDECTOMY    . URETHRAL DIVERTICULECTOMY      Allergies: Codeine  Medications: Prior to Admission medications   Medication Sig Start Date End Date Taking? Authorizing Provider  acetaminophen (TYLENOL) 500 MG tablet Take 500 mg by mouth every 6 (six) hours as needed for mild pain, moderate pain or headache.     [provider]  cephALEXin (KEFLEX) 500 MG capsule Take 1 capsule (500 mg total) by mouth 3 (three) times daily. Patient not taking: Reported on 07/15/2019 02/06/19   Raeford Razor, MD  gabapentin (NEURONTIN) 300 MG capsule Take 300 mg by mouth at bedtime as needed (pain).  04/17/19   [provider]  Multiple Vitamin (MULTIVITAMIN WITH MINERALS) TABS tablet Take 1 tablet by mouth daily.    [provider]  oxybutynin (DITROPAN) 5 MG tablet Take 5 mg by mouth daily.  06/25/19   [provider]  polyethylene glycol (MIRALAX / GLYCOLAX) 17 g packet Take 17 g by mouth daily as needed for mild  constipation.    [provider]  polyethylene glycol powder (MIRALAX) 17 GM/SCOOP powder Start taking 1 capful 3 times a day. Slowly cut back as needed until you have normal bowel movements. 07/16/19   Nira Conn, MD  pravastatin (PRAVACHOL) 80 MG tablet Take 80 mg by mouth daily.  10/05/18   [provider]  traMADol (ULTRAM) 50 MG tablet Take 50 mg by mouth 2 (two) times daily as needed (for pain).     [provider]  warfarin (COUMADIN) 3 MG tablet Take 3-4.5 mg by mouth See admin instructions. Take 1 & 1/2 (4.5 MG) On Tuesday & Friday and take 1 tablet (3 MG) on Sunday, Monday, Wednesday, Thursday, Saturday    [provider]     No family history on file.  Social History   Socioeconomic History  . Marital status: Single    Spouse name: Not on file  . Number of children: 0  . Years of education: College  . Highest education level: Not on file  Occupational History  . Not on file  Tobacco Use  . Smoking status: Former Games developer  . Smokeless tobacco: Never Used  . Tobacco comment: 1981  Substance and Sexual Activity  . Alcohol use: Yes    Comment: occa  . Drug use: No  . Sexual activity: Not on file  Other Topics Concern  . Not on file  Social History Narrative   Patient is single and lives with his sister.  Patient is retired.   Patient drinks a half cup to one cup of caffeine daily.   Patient is right-handed.   Patient has a college education.   Social Determinants of Health   Financial Resource Strain:   . Difficulty of Paying Living Expenses: Not on file  Food Insecurity:   . Worried About Programme researcher, broadcasting/film/video in the Last Year: Not on file  . Ran Out of Food in the Last Year: Not on file  Transportation Needs:   . Lack of Transportation (Medical): Not on file  . Lack of Transportation (Non-Medical): Not on file  Physical Activity:   . Days of Exercise per Week: Not on file  . Minutes of Exercise per Session: Not on  file  Stress:   . Feeling of Stress : Not on file  Social Connections:   . Frequency of Communication with Friends and Family: Not on file  . Frequency of Social Gatherings with Friends and Family: Not on file  . Attends Religious Services: Not on file  . Active Member of Clubs or Organizations: Not on file  . Attends Banker Meetings: Not on file  . Marital Status: Not on file      Review of Systems currently denies fever, headache, chest pain, dyspnea, cough, back pain, nausea, vomiting or bleeding.  He does have some mild suprapubic discomfort.  Vital Signs: Pressure 114/69, heart rate 108, temperature 98.9, respirations 18, O2 sat 92% room air   Physical Exam awake, alert.  Chest clear to auscultation bilaterally.  Heart with regular rate and rhythm.  Abdomen soft, positive bowel sounds, some mild suprapubic tenderness to palpation, Foley catheter in place draining yellow urine, atrophy of muscles in all 4 extremities with some chronic leg swelling.  Noted foot drop with atrophy(post polio)  Imaging: CT Abdomen Pelvis W Contrast  Result Date: 07/16/2019 CLINICAL DATA:  Fever and weakness EXAM: CT ABDOMEN AND PELVIS WITH CONTRAST TECHNIQUE: Multidetector CT imaging of the abdomen and pelvis was performed using the standard protocol following bolus administration of intravenous contrast. CONTRAST:  OMNIPAQUE IOHEXOL 300 MG/ML  SOLN COMPARISON:  None. FINDINGS: LOWER CHEST: Normal. HEPATOBILIARY: Normal hepatic contours. No intra- or extrahepatic biliary dilatation. Normal gallbladder. PANCREAS: Normal pancreas. No ductal dilatation or peripancreatic fluid collection. SPLEEN: Normal. ADRENALS/URINARY TRACT: The adrenal glands are normal. No hydronephrosis, nephroureterolithiasis or solid renal mass. Decompressed by Foley catheter. Thickened, crenulated appearance of the urinary bladder wall. STOMACH/BOWEL: There is no hiatal hernia. Normal duodenal course and caliber. No  small bowel dilatation or inflammation. No focal colonic abnormality. Dense distal colonic stool. Surgically absent. VASCULAR/LYMPHATIC: There is calcific atherosclerosis of the abdominal aorta. No abdominal or pelvic lymphadenopathy. REPRODUCTIVE: Normal prostate size with symmetric seminal vesicles. MUSCULOSKELETAL. Severe levoscoliosis. OTHER: None. IMPRESSION: 1. No acute abnormality of the abdomen or pelvis. 2. Thickened, crenulated appearance of the urinary bladder wall, consistent with neurogenic bladder. 3. Aortic Atherosclerosis (ICD10-I70.0). Electronically Signed   By: Deatra Robinson M.D.   On: 07/16/2019 01:23   DG Chest Port 1 View  Result Date: 07/15/2019 CLINICAL DATA:  Fever and cough for 2 days. EXAM: PORTABLE CHEST 1 VIEW COMPARISON:  12/06/2018 FINDINGS: The heart size and mediastinal contours are within normal limits. Aortic atherosclerosis incidentally noted. Both lungs are clear. Thoracic dextroscoliosis again noted, as well as cervical spine fusion hardware. IMPRESSION: No active disease. Electronically Signed   By: Danae Orleans M.D.   On: 07/15/2019 21:32    Labs:  CBC: Recent  Labs    12/06/18 1228 12/09/18 1657 02/06/19 1228 07/15/19 2159  WBC 16.3* 16.5* 12.8* 14.5*  HGB 14.6 13.5 14.6 11.9*  HCT 45.5 42.8 46.0 37.4*  PLT 329 411* 289 376    COAGS: Recent Labs    12/06/18 1228 12/09/18 1657 02/06/19 1228 07/15/19 2159  INR 1.8* 1.6* 6.6* 3.5*  APTT  --   --   --  39*    BMP: Recent Labs    12/06/18 1228 12/06/18 1228 12/09/18 1657 02/06/19 1228 04/10/19 1405 07/15/19 2159  NA 138  --  136 137  --  134*  K 5.1  --  4.1 4.4  --  4.3  CL 103  --  100 100  --  97*  CO2 22  --  26 22  --  28  GLUCOSE 96  --  97 113*  --  138*  BUN 13  --  11 15  --  17  CALCIUM 9.4  --  9.5 9.7  --  9.5  CREATININE 0.41*   < > 0.33* 0.47* 0.40* 0.38*  GFRNONAA >60  --  >60 >60  --  >60  GFRAA >60  --  >60 >60  --  >60   < > = values in this interval not  displayed.    LIVER FUNCTION TESTS: Recent Labs    12/06/18 1228 12/09/18 1657 07/15/19 2159  BILITOT 1.7* 0.7 0.6  AST 66* 23 27  ALT 25 21 14   ALKPHOS 62 60 58  PROT 7.6 7.4 7.5  ALBUMIN 3.4* 3.3* 3.3*    TUMOR MARKERS: No results for input(s): AFPTM, CEA, CA199, CHROMGRNA in the last 8760 hours.  Assessment and Plan: 79 y.o. 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  Patient on Coumadin  for prior lower extremity DVT.  PT today 13.6, INR 1.1.   Thank you for this interesting consult.  I greatly enjoyed meeting Damon Colon and look forward to participating in their care.  A copy of this report was sent to the requesting provider on this date.  Electronically Signed: D. Rowe Demarquez, PA-C 07/24/2019, 10:28 AM   I spent a total of  25 minutes   in face to face in clinical consultation, greater than 50% of which was counseling/coordinating care for suprapubic catheter placement

## 2019-07-24 NOTE — Discharge Instructions (Addendum)
Please call Interventional Radiology clinic [phone] with any questions about your catheter.   Suprapubic Catheter Home Guide A suprapubic catheter is a flexible tube that is used to drain urine from the bladder into a collection bag outside the body. The catheter is inserted into the bladder through a small opening in the lower abdomen, above the pubic bone (suprapubic area) and a few inches below your belly button (navel). A tiny balloon filled with germ-free (sterile) water helps to keep the catheter in place. The collection bag must be emptied at least once a day and cleaned at least every other day. The collection bag can be put beside your bed at night and attached to your leg during the day. You may have a large collection bag to use at night and a smaller one to use during the day. Your suprapubic catheter may need to be changed every 4-6 weeks, or as often as recommended by your health care provider. Healing of the tract where the catheter is placed can take 6 weeks to 6 months. During that time, your health care provider may change your catheter. Once the tract is well healed, you or a caregiver will change your suprapubic catheter at home. What are the risks? This catheter is safe to use. However, problems can occur, including:  Blocked urine flow. This can occur if the catheter stops working, or if you have a blood clot in your bladder or in the catheter.  Irritation of the skin around the catheter.  Infection. This can happen if bacteria gets into your bladder.  How to care for the skin around the catheter Follow your health care provider's instructions on caring for your skin.  Use a clean washcloth and soapy water to clean the skin around your catheter every day. Pat the area dry with a clean paper towel.  Do not pull on the catheter.  Do not use ointment or lotion on this area, unless told by your health care provider.  Check the skin around the catheter every day for  signs of infection. Check for: ? Redness, swelling, or pain. ? Fluid or blood. ? Warmth. ? Pus or a bad smell. How to empty and clean the collection bag Empty the large collection bag every 8 hours. Empty the small collection bag when it is about ? full. Clean the collection bag every 2-3 days, or as often as told by your health care provider. To do this: 1. Wash your hands with soap and water. If soap and water are not available, use hand sanitizer. 2. Hold the used bag over the toilet or another container. 3. Turn the valve (spigot) at the bottom of the bag to empty the urine. Empty the used bag completely. ? Do not touch the opening of the spigot. ? Do not let the opening touch the toilet or container. 4. Close the spigot tightly when the bag is empty. 5. Clean the used bag in one of the following methods: ? According to the manufacturer's instructions. ? As told by your health care provider. 6. Let the bag dry completely. Put it in a clean plastic bag before storing it. General tips   Always wash your hands before and after caring for your catheter and collection bag. Use a mild, fragrance-free soap. If soap and water are not available, use hand sanitizer.  Clean the outside of the catheter with soap and water as often as told by your health care provider.  Always make sure there are no twists  or kinks in the catheter tube.  Always make sure there are no leaks in the catheter or collection bag.  Always wear the leg bag below your knee.  Make sure the overnight drainage bag is always lower than the level of your bladder, but do not let it touch the floor. Before you go to sleep, hang the bag inside a wastebasket that is covered by a clean plastic bag.  Drink enough fluid to keep your urine pale yellow.  Do not take baths, swim, or use a hot tub until your health care provider approves. Ask your health care provider if you may take showers. Contact a heath care provider  if:  You leak urine.  You have redness, swelling, or pain around your catheter.  You have fluid or blood coming from your catheter opening.  Your catheter opening feels warm to the touch.  You have pus or a bad smell coming from your catheter opening.  You have a fever or chills.  Your urine flow slows down.  Your urine becomes cloudy or smelly. Get help right away if:  Your catheter comes out.  You have: ? Nausea. ? Back pain. ? Difficulty changing your catheter. ? Blood in your urine. ? No urine flow for 1 hour. Summary  A suprapubic catheter is a flexible tube that is used to drain urine from the bladder into a collection bag outside the body.  Your suprapubic catheter may need to be changed every 4-6 weeks, or as recommended by your health care provider.  Follow instructions on how to change the catheter and how to empty and clean the collection bag.  Always wash your hands before and after caring for your catheter and collection bag. Drink enough fluid to keep your urine pale yellow.  Get help right away if you have difficulty changing your catheter or if there is blood in your urine. This information is not intended to replace advice given to you by your health care provider. Make sure you discuss any questions you have with your health care provider. Document Revised: 09/05/2018 Document Reviewed: 06/19/2018 Elsevier Patient Education  2020 [employer]   Moderate Conscious Sedation, Adult, Care After These instructions provide you with information about caring for yourself after your procedure. Your health care provider may also give you more specific instructions. Your treatment has been planned according to current medical practices, but problems sometimes occur. Call your health care provider if you have any problems or questions after your procedure. What can I expect after the procedure? After your procedure, it is common:  To feel sleepy for several  hours.  To feel clumsy and have poor balance for several hours.  To have poor judgment for several hours.  To vomit if you eat too soon. Follow these instructions at home: For at least 24 hours after the procedure:   Do not: ? Participate in activities where you could fall or become injured. ? Drive. ? Use heavy machinery. ? Drink alcohol. ? Take sleeping pills or medicines that cause drowsiness. ? Make important decisions or sign legal documents. ? Take care of children on your own.  Rest. Eating and drinking  Follow the diet recommended by your health care provider.  If you vomit: ? Drink water, juice, or soup when you can drink without vomiting. ? Make sure you have little or no nausea before eating solid foods. General instructions  Have a responsible adult stay with you until you are awake and alert.  Take  over-the-counter and prescription medicines only as told by your health care provider.  If you smoke, do not smoke without supervision.  Keep all follow-up visits as told by your health care provider. This is important. Contact a health care provider if:  You keep feeling nauseous or you keep vomiting.  You feel light-headed.  You develop a rash.  You have a fever. Get help right away if:  You have trouble breathing. This information is not intended to replace advice given to you by your health care provider. Make sure you discuss any questions you have with your health care provider. Document Revised: 04/27/2017 Document Reviewed: 09/04/2015 Elsevier Patient Education  2020 [employer].

## 2019-07-29 ENCOUNTER — Telehealth: Payer: Self-pay | Admitting: Internal Medicine

## 2019-07-29 NOTE — Telephone Encounter (Signed)
Actually sister had sent an email to [facility] instead of a phone call.

## 2019-07-29 NOTE — Telephone Encounter (Signed)
Called patient's sister Harriett Sine to offer to schedule a Palliative f/u visit with our NP, no answer - left message with reason for call along with my name and contact number.  I also replied back to her email as well and provided her with my contact information.

## 2019-08-01 ENCOUNTER — Telehealth: Payer: Self-pay | Admitting: Internal Medicine

## 2019-08-01 NOTE — Telephone Encounter (Signed)
Called patient's sister, Kemet Nijjar to f/u from an email that she had sent to [facility] to possibly schedule a visit, no answer - left message with my name and contact number.  I then called and spoke with patient's caregiver Leta and explained to her that I have been trying to reach [place] and have left messages but haven't rec'd a call back from her.  Caregiver said she would let her know that I have been trying to reach her.

## 2019-08-04 ENCOUNTER — Telehealth: Payer: Self-pay | Admitting: Internal Medicine

## 2019-08-04 NOTE — Telephone Encounter (Signed)
Rec'd voicemail from patient's sister Memory Argue, late Friday afternoon (08/01/19), returned call to schedule a Palliative visit - no answer, left message with contact information.

## 2019-08-06 ENCOUNTER — Telehealth: Payer: Self-pay | Admitting: Internal Medicine

## 2019-08-06 NOTE — Telephone Encounter (Signed)
Returned call to patient's sister, Sabastian Raimondi and have scheduled an In-Person Palliative f/u visit for 08/29/19 @ 12 Noon.

## 2019-08-20 ENCOUNTER — Other Ambulatory Visit (HOSPITAL_COMMUNITY): Payer: Self-pay | Admitting: Urology

## 2019-08-20 DIAGNOSIS — R339 Retention of urine, unspecified: Secondary | ICD-10-CM

## 2019-08-26 ENCOUNTER — Other Ambulatory Visit: Payer: Medicare Other | Admitting: Internal Medicine

## 2019-08-27 ENCOUNTER — Inpatient Hospital Stay (HOSPITAL_COMMUNITY): Admission: RE | Admit: 2019-08-27 | Payer: Medicare Other | Source: Ambulatory Visit

## 2019-08-27 ENCOUNTER — Other Ambulatory Visit: Payer: Self-pay

## 2019-08-29 ENCOUNTER — Other Ambulatory Visit: Payer: Medicare Other | Admitting: Internal Medicine

## 2019-08-29 ENCOUNTER — Other Ambulatory Visit: Payer: Self-pay

## 2019-09-01 ENCOUNTER — Ambulatory Visit (HOSPITAL_COMMUNITY)
Admission: RE | Admit: 2019-09-01 | Discharge: 2019-09-01 | Disposition: A | Discharge status: Home / Self Care | Payer: Medicare Other | Source: Ambulatory Visit | Attending: Urology | Admitting: Urology

## 2019-09-01 ENCOUNTER — Other Ambulatory Visit (HOSPITAL_COMMUNITY): Payer: Self-pay | Admitting: Urology

## 2019-09-01 ENCOUNTER — Other Ambulatory Visit: Payer: Self-pay

## 2019-09-01 DIAGNOSIS — R339 Retention of urine, unspecified: Secondary | ICD-10-CM | POA: Diagnosis not present

## 2019-09-01 DIAGNOSIS — Z436 Encounter for attention to other artificial openings of urinary tract: Secondary | ICD-10-CM | POA: Diagnosis not present

## 2019-09-01 HISTORY — PX: IR CATHETER TUBE CHANGE: IMG717

## 2019-09-01 MED ORDER — LIDOCAINE VISCOUS HCL 2 % MT SOLN
OROMUCOSAL | Status: DC | PRN
  Administered 2019-09-01: 5 mL

## 2019-09-01 MED ORDER — LIDOCAINE HCL 1 % IJ SOLN
INTRAMUSCULAR | Status: DC | PRN
  Administered 2019-09-01: 10 mL via INTRADERMAL

## 2019-09-01 MED ORDER — LIDOCAINE VISCOUS HCL 2 % MT SOLN
OROMUCOSAL | Status: AC
  Filled 2019-09-01: qty 15

## 2019-09-01 MED ORDER — LIDOCAINE HCL 1 % IJ SOLN
INTRAMUSCULAR | Status: AC
  Filled 2019-09-01: qty 20

## 2019-09-01 MED ORDER — IOHEXOL 300 MG/ML  SOLN
50.0000 mL | Freq: Once | INTRAMUSCULAR | Status: AC | PRN
  Administered 2019-09-01: 10 mL

## 2019-09-30 ENCOUNTER — Other Ambulatory Visit: Payer: Medicare Other | Admitting: Internal Medicine

## 2019-09-30 DIAGNOSIS — G8250 Quadriplegia, unspecified: Secondary | ICD-10-CM

## 2019-09-30 DIAGNOSIS — Z515 Encounter for palliative care: Secondary | ICD-10-CM

## 2019-09-30 NOTE — Progress Notes (Signed)
[facility] Consult Note Telephone: [phone]  Fax: [phone]  PATIENT NAME: Damon Colon DOB: Jul 08, 1940 MRN: [id number]  PRIMARY CARE PROVIDER:   Hulan Fess, MD  REFERRING PROVIDER:  Hulan Fess, MD [place],  [place] [address]  RESPONSIBLE PARTY:   self and sister  [place], [place] [phone]     RECOMMENDATIONS and PLAN:  Palliative care encounter  Z51.5  1. Advance care planning:  Living will in place.  Primary goals remain to attempt longevity and quality of life with assistance of 24hour caregivers and his sister.  He wants to avoid hospitalization if possible.  Palliative care will followup with patient in aprox 6-8 weeks.   2. Weakness:  Chronic and at baseline.  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  Tolerating intermittent OOB in wheelchair activities with assistance of caregivers.  Continue personal total care.  Monitor for changes of status  3.  Dysphagia:  Aspiration precautions reviewed.  Increase time with consuming meals. Alternate food and hydration.  Monitor   I spent 40 minutes providing this consultation,  from 1300 to 1340. More than 50% of the time in this consultation was spent coordinating communication patient, sister and caregiver Lettie.   HISTORY OF PRESENT ILLNESS:   Follow-up with Jerelyn Charles.  He and caregivers deny any recent illnesses or injuries. He does report a few episodes of "choking sensations" with eating. He has received both COVID-19 vaccines.   Palliative Care was asked to help address goals of care.   CODE STATUS: CPR  PPS: 30%  HOSPICE ELIGIBILITY/DIAGNOSIS: TBD  PAST MEDICAL HISTORY:  Past Medical History:  Diagnosis Date  . Arthritis   . Bruises easily   . Fever    102  . Hypertension   . Paraplegia ([place])   . Polio   . 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 Wears glasses      PERTINENT MEDICATIONS:  Outpatient Encounter  Medications as of 09/30/2019  Medication Sig  . acetaminophen (TYLENOL) 500 MG tablet Take 500 mg by mouth every 6 (six) hours as needed for mild pain, moderate pain or headache.   . cephALEXin (KEFLEX) 500 MG capsule Take 1 capsule (500 mg total) by mouth 3 (three) times daily. (Patient not taking: Reported on 07/15/2019)  . gabapentin (NEURONTIN) 300 MG capsule Take 300 mg by mouth at bedtime as needed (pain).   . Multiple Vitamin (MULTIVITAMIN WITH MINERALS) TABS tablet Take 1 tablet by mouth daily.  Marland Kitchen oxybutynin (DITROPAN) 5 MG tablet Take 5 mg by mouth daily.   . polyethylene glycol (MIRALAX / GLYCOLAX) 17 g packet Take 17 g by mouth daily as needed for mild constipation.  . polyethylene glycol powder (MIRALAX) 17 GM/SCOOP powder Start taking 1 capful 3 times a day. Slowly cut back as needed until you have normal bowel movements.  . pravastatin (PRAVACHOL) 80 MG tablet Take 80 mg by mouth daily.   . traMADol (ULTRAM) 50 MG tablet Take 50 mg by mouth 2 (two) times daily as needed (for pain).   Marland Kitchen warfarin (COUMADIN) 3 MG tablet Take 3-4.5 mg by mouth See admin instructions. Take 1 & 1/2 (4.5 MG) On Tuesday & Friday and take 1 tablet (3 MG) on Sunday, Monday, Wednesday, Thursday, Saturday   No facility-administered encounter medications on file as of 09/30/2019.    PHYSICAL EXAM:   General: NAD, frail appearing, thin Cardiovascular: regular rate and rhythm Pulmonary: Clear throughout Abdomen: soft, nontender, + bowel sounds.  GU: Mild suprapubic tenderness.  Suprapubic catheter inplace and patent of amber urine Extremities: no edema, Contracture deformities of fingers and toes Skin: Pink in color;  exposed skin is intact Neurological: Alert and oriented.  Weakness but otherwise nonfocal Psych: In good spirits.    Margaretha Sheffield, NP-C

## 2019-10-01 ENCOUNTER — Other Ambulatory Visit: Payer: Self-pay

## 2019-10-28 ENCOUNTER — Inpatient Hospital Stay (HOSPITAL_COMMUNITY): Admission: RE | Admit: 2019-10-28 | Payer: Medicare Other | Source: Ambulatory Visit

## 2019-11-17 ENCOUNTER — Other Ambulatory Visit (HOSPITAL_COMMUNITY): Payer: Medicare Other

## 2020-01-20 ENCOUNTER — Other Ambulatory Visit: Payer: Medicare Other | Admitting: Internal Medicine

## 2020-01-20 DIAGNOSIS — Z515 Encounter for palliative care: Secondary | ICD-10-CM

## 2020-01-20 DIAGNOSIS — E46 Unspecified protein-calorie malnutrition: Secondary | ICD-10-CM

## 2020-01-21 NOTE — Progress Notes (Signed)
[facility] Palliative Care Consult Note Telephone: [phone]  Fax: [phone]  PATIENT NAME: LC JOYNT DOB: 10-30-1940 MRN: [id number]  PRIMARY CARE PROVIDER:   Catha Gosselin, MD  REFERRING PROVIDER:  Catha Gosselin, MD [address] [place],  [place] [address]  RESPONSIBLE PARTY:   self and sister  [place], [place] [phone]     RECOMMENDATIONS and PLAN:  Palliative care encounter  Z51.5  1. Advance care planning:  Living will in place. Goals are unchanged. Live in residence with assistance of private caregivers  Avoid hospitalization if possible.  Palliative care will followup with patient in aprox 8 weeks.   2. Weakness:  At baseline.  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  Continue OOB in wheelchair as tolerated. Continue total care by caregivers  Monitor for changes of status  3.  Dysphagia:  At baseline with recent occurences  Monitor   4.  Protein calorie malnutrition:   Small frequent meals. Improve hydration.  Attempt 2-3 Ensure shakes per day.  Comfort feedings.   I spent 40 minutes providing this consultation,  from 1115 to 1150. More than 50% of the time in this consultation was spent coordinating communication patient and caregiver    HISTORY OF PRESENT ILLNESS:   Follow-up with Kennon Rounds.  He and caregivers deny any recent illnesses or injuries. He has been getting OOB 3-4 days per week and tolerating same well   Palliative Care was asked to help address goals of care.   CODE STATUS: CPR  PPS: 30%  HOSPICE ELIGIBILITY/DIAGNOSIS: TBD  PAST MEDICAL HISTORY:  Past Medical History:  Diagnosis Date  . Arthritis   . Bruises easily   . Fever    102  . Hypertension   . Paraplegia ([facility])   . Polio   . 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 Wears glasses      PERTINENT MEDICATIONS:  Outpatient Encounter Medications as of 09/30/2019  Medication Sig  . acetaminophen (TYLENOL) 500 MG tablet Take 500 mg  by mouth every 6 (six) hours as needed for mild pain, moderate pain or headache.   . cephALEXin (KEFLEX) 500 MG capsule Take 1 capsule (500 mg total) by mouth 3 (three) times daily. (Patient not taking: Reported on 07/15/2019)  . gabapentin (NEURONTIN) 300 MG capsule Take 300 mg by mouth at bedtime as needed (pain).   . Multiple Vitamin (MULTIVITAMIN WITH MINERALS) TABS tablet Take 1 tablet by mouth daily.  Marland Kitchen oxybutynin (DITROPAN) 5 MG tablet Take 5 mg by mouth daily.   . polyethylene glycol (MIRALAX / GLYCOLAX) 17 g packet Take 17 g by mouth daily as needed for mild constipation.  . polyethylene glycol powder (MIRALAX) 17 GM/SCOOP powder Start taking 1 capful 3 times a day. Slowly cut back as needed until you have normal bowel movements.  . pravastatin (PRAVACHOL) 80 MG tablet Take 80 mg by mouth daily.   . traMADol (ULTRAM) 50 MG tablet Take 50 mg by mouth 2 (two) times daily as needed (for pain).   Marland Kitchen warfarin (COUMADIN) 3 MG tablet Take 3-4.5 mg by mouth See admin instructions. Take 1 & 1/2 (4.5 MG) On Tuesday & Friday and take 1 tablet (3 MG) on Sunday, Monday, Wednesday, Thursday, Saturday   No facility-administered encounter medications on file as of 09/30/2019.    PHYSICAL EXAM:   General: NAD, frail appearing, thin elderly male sitting in wheelchair Cardiovascular: regular rate and rhythm Pulmonary: Clear throughout. Unlabored respirations Abdomen: soft, nontender, + bowel  sounds.   GU: Suprapubic catheter inplace and patent of amber urine Extremities: no edema, Contracture deformities of fingers and toes Skin: exposed skin is intact Neurological: Alert and oriented.  Generalized weakness Psych: In good spirits.    Margaretha Sheffield, NP-C

## 2020-01-22 ENCOUNTER — Other Ambulatory Visit: Payer: Self-pay

## 2020-03-05 ENCOUNTER — Emergency Department (HOSPITAL_COMMUNITY)
Admission: EM | Admit: 2020-03-05 | Discharge: 2020-03-06 | Disposition: A | Discharge status: Home / Self Care | Payer: Medicare Other | Attending: Emergency Medicine | Admitting: Emergency Medicine

## 2020-03-05 ENCOUNTER — Encounter (HOSPITAL_COMMUNITY): Payer: Self-pay

## 2020-03-05 ENCOUNTER — Other Ambulatory Visit: Payer: Self-pay

## 2020-03-05 DIAGNOSIS — N3001 Acute cystitis with hematuria: Secondary | ICD-10-CM

## 2020-03-05 DIAGNOSIS — Z7901 Long term (current) use of anticoagulants: Secondary | ICD-10-CM | POA: Insufficient documentation

## 2020-03-05 DIAGNOSIS — Z87891 Personal history of nicotine dependence: Secondary | ICD-10-CM | POA: Diagnosis not present

## 2020-03-05 DIAGNOSIS — I10 Essential (primary) hypertension: Secondary | ICD-10-CM | POA: Insufficient documentation

## 2020-03-05 DIAGNOSIS — R319 Hematuria, unspecified: Secondary | ICD-10-CM | POA: Diagnosis present

## 2020-03-05 LAB — URINALYSIS, ROUTINE W REFLEX MICROSCOPIC
Bilirubin Urine: NEGATIVE
Glucose, UA: NEGATIVE mg/dL
Ketones, ur: 20 mg/dL — AB
Nitrite: POSITIVE — AB
Protein, ur: 100 mg/dL — AB
RBC / HPF: >50 RBC/hpf — ABNORMAL HIGH (ref 0–5)
Specific Gravity, Urine: 1.014 (ref 1.005–1.030)
WBC, UA: >50 WBC/hpf — ABNORMAL HIGH (ref 0–5)
pH: 6 (ref 5.0–8.0)

## 2020-03-05 LAB — CBC WITH DIFFERENTIAL/PLATELET
Abs Immature Granulocytes: 0.07 10*3/uL (ref 0.00–0.07)
Basophils Absolute: 0.1 10*3/uL (ref 0.0–0.1)
Basophils Relative: 0 %
Eosinophils Absolute: 0.3 10*3/uL (ref 0.0–0.5)
Eosinophils Relative: 2 %
HCT: 35.7 % — ABNORMAL LOW (ref 39.0–52.0)
Hemoglobin: 11.5 g/dL — ABNORMAL LOW (ref 13.0–17.0)
Immature Granulocytes: 0 %
Lymphocytes Relative: 16 %
Lymphs Abs: 2.9 10*3/uL (ref 0.7–4.0)
MCH: 30.8 pg (ref 26.0–34.0)
MCHC: 32.2 g/dL (ref 30.0–36.0)
MCV: 95.7 fL (ref 80.0–100.0)
Monocytes Absolute: 1.7 10*3/uL — ABNORMAL HIGH (ref 0.1–1.0)
Monocytes Relative: 10 %
Neutro Abs: 12.8 10*3/uL — ABNORMAL HIGH (ref 1.7–7.7)
Neutrophils Relative %: 72 %
Platelets: 238 10*3/uL (ref 150–400)
RBC: 3.73 MIL/uL — ABNORMAL LOW (ref 4.22–5.81)
RDW: 14 % (ref 11.5–15.5)
WBC: 17.9 10*3/uL — ABNORMAL HIGH (ref 4.0–10.5)
nRBC: 0 % (ref 0.0–0.2)

## 2020-03-05 LAB — BASIC METABOLIC PANEL
Anion gap: 10 (ref 5–15)
BUN: 19 mg/dL (ref 8–23)
CO2: 27 mmol/L (ref 22–32)
Calcium: 9.5 mg/dL (ref 8.9–10.3)
Chloride: 101 mmol/L (ref 98–111)
Creatinine, Ser: <0.3 mg/dL — ABNORMAL LOW (ref 0.61–1.24)
Glucose, Bld: 81 mg/dL (ref 70–99)
Potassium: 4.4 mmol/L (ref 3.5–5.1)
Sodium: 138 mmol/L (ref 135–145)

## 2020-03-05 LAB — PROTIME-INR
INR: 1.3 — ABNORMAL HIGH (ref 0.8–1.2)
Prothrombin Time: 15.4 seconds — ABNORMAL HIGH (ref 11.4–15.2)

## 2020-03-05 MED ORDER — CEPHALEXIN 500 MG PO CAPS: 500.0000 mg | ORAL_CAPSULE | Freq: Once | ORAL | Status: DC

## 2020-03-05 MED ORDER — CEPHALEXIN 500 MG PO CAPS: 500.0000 mg | ORAL_CAPSULE | Freq: Two times a day (BID) | ORAL | 0 refills | Status: AC

## 2020-03-05 MED ORDER — SODIUM CHLORIDE 0.9 % IV SOLN
1.0000 g | Freq: Once | INTRAVENOUS | Status: AC
  Administered 2020-03-05: 1 g via INTRAVENOUS
  Filled 2020-03-05: qty 10

## 2020-03-05 NOTE — ED Provider Notes (Addendum)
[facility]   CSN: [id number] Arrival date & time: 03/05/20  1606     History Chief Complaint  Patient presents with   catheter issues    Damon Colon is a 79 y.o. male.  Patient is a 79 year old male with a history of hypertension, post poliomyelitis muscular atrophy and paraplegia who presents with blood in his urine. He has an indwelling Foley catheter. He has been having some bladder spasms for the last few days and noticed some blood clots in his urine since yesterday. He is on Coumadin. He states he has had some intermittent blood in his urine in the past which has resolved. He says he takes some medication for bladder spasms. He denies any fevers. He has a little bit of discomfort in his lower abdomen.        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 No pertinent family history.  Social History   Tobacco Use   Smoking status: Former Smoker   Smokeless tobacco: Never Used   Tobacco comment: 1981  Vaping Use   Vaping Use: Never used  Substance Use Topics   Alcohol use: Yes    Comment: occa   Drug use: No    Home Medications Prior to Admission medications   Medication Sig Start Date  End Date Taking? Authorizing Provider  acetaminophen (TYLENOL) 500 MG tablet Take 500 mg by mouth every 6 (six) hours as needed for mild pain, moderate pain or headache.     [provider]  cephALEXin (KEFLEX) 500 MG capsule Take 1 capsule (500 mg total) by mouth 3 (three) times daily. Patient not taking: Reported on 07/15/2019 02/06/19   Raeford Razor, MD  gabapentin (NEURONTIN) 300 MG capsule Take 300 mg by mouth at bedtime as needed (pain).  04/17/19   [provider]  Multiple Vitamin (MULTIVITAMIN WITH MINERALS) TABS tablet Take 1 tablet by mouth daily.    [provider]  oxybutynin (DITROPAN) 5 MG tablet Take 5 mg by mouth daily.  06/25/19   [provider]  polyethylene glycol (MIRALAX / GLYCOLAX) 17 g packet Take 17 g by mouth daily as needed for mild constipation.    [provider]  polyethylene glycol powder (MIRALAX) 17 GM/SCOOP powder Start taking 1 capful 3 times a day. Slowly cut back as needed until you have normal bowel movements. 07/16/19   Nira Conn, MD  pravastatin (PRAVACHOL) 80 MG tablet Take 80 mg by mouth daily.  10/05/18   [provider]  traMADol (ULTRAM) 50 MG tablet Take 50 mg by mouth 2 (two) times daily  as needed (for pain).     [provider]  warfarin (COUMADIN) 3 MG tablet Take 3-4.5 mg by mouth See admin instructions. Take 1 & 1/2 (4.5 MG) On Tuesday & Friday and take 1 tablet (3 MG) on Sunday, Monday, Wednesday, Thursday, Saturday    [provider]    Allergies    Codeine  Review of Systems   Review of Systems  Constitutional: Negative for chills, diaphoresis, fatigue and fever.  HENT: Negative for congestion, rhinorrhea and sneezing.   Eyes: Negative.   Respiratory: Negative for cough, chest tightness and shortness of breath.   Cardiovascular: Negative for chest pain and leg swelling.  Gastrointestinal: Positive for abdominal pain. Negative for blood in stool, diarrhea,  nausea and vomiting.  Genitourinary: Positive for hematuria. Negative for difficulty urinating, flank pain and frequency.  Musculoskeletal: Negative for arthralgias and back pain.  Skin: Negative for rash.  Neurological: Negative for dizziness, speech difficulty, weakness, numbness and headaches.    Physical Exam Updated Vital Signs BP (!) 130/59    Pulse 89    Temp 98.4 F (36.9 C) (Oral)    Resp 16    Ht 5\' 8"  (1.727 m)    Wt 72.6 kg    SpO2 99%    BMI 24.33 kg/m   Physical Exam Constitutional:      Appearance: He is well-developed.  HENT:     Head: Normocephalic and atraumatic.  Eyes:     Pupils: Pupils are equal, round, and reactive to light.  Cardiovascular:     Rate and Rhythm: Normal rate and regular rhythm.     Heart sounds: Normal heart sounds.  Pulmonary:     Effort: Pulmonary effort is normal. No respiratory distress.     Breath sounds: Normal breath sounds. No wheezing or rales.  Chest:     Chest wall: No tenderness.  Abdominal:     General: Bowel sounds are normal.     Palpations: Abdomen is soft.     Tenderness: There is abdominal tenderness (Mild tenderness to the suprapubic area). There is no guarding or rebound.  Genitourinary:    Comments: Indwelling suprapubic catheter. His urine is dark but not grossly bloody. However it does appear that there is some small blood clots in his urine. Musculoskeletal:        General: Normal range of motion.     Cervical back: Normal range of motion and neck supple.  Lymphadenopathy:     Cervical: No cervical adenopathy.  Skin:    General: Skin is warm and dry.     Findings: No rash.  Neurological:     Mental Status: He is alert and oriented to person, place, and time.     ED Results / Procedures / Treatments   Labs (all labs ordered are listed, but only abnormal results are displayed) Labs Reviewed  BASIC METABOLIC PANEL - Abnormal; Notable for the following components:      Result Value   Creatinine, Ser <0.30  (*)    All other components within normal limits  CBC WITH DIFFERENTIAL/PLATELET - Abnormal; Notable for the following components:   WBC 17.9 (*)    RBC 3.73 (*)    Hemoglobin 11.5 (*)    HCT 35.7 (*)    Neutro Abs 12.8 (*)    Monocytes Absolute 1.7 (*)    All other components within normal limits  PROTIME-INR - Abnormal; Notable for the following components:   Prothrombin Time 15.4 (*)    INR 1.3 (*)  All other components within normal limits  URINALYSIS, ROUTINE W REFLEX MICROSCOPIC - Abnormal; Notable for the following components:   Color, Urine AMBER (*)    APPearance CLOUDY (*)    Hgb urine dipstick MODERATE (*)    Ketones, ur 20 (*)    Protein, ur 100 (*)    Nitrite POSITIVE (*)    Leukocytes,Ua MODERATE (*)    RBC / HPF >50 (*)    WBC, UA >50 (*)    Bacteria, UA FEW (*)    Non Squamous Epithelial 6-10 (*)    All other components within normal limits  URINE CULTURE    EKG None  Radiology No results found.  Procedures Procedures (including critical care time)  Medications Ordered in ED Medications  cefTRIAXone (ROCEPHIN) 1 g in sodium chloride 0.9 % 100 mL IVPB (has no administration in time range)    ED Course  I have reviewed the triage vital signs and the nursing notes.  Pertinent labs & imaging results that were available during my care of the patient were reviewed by me and considered in my medical decision making (see chart for details).    MDM Rules/Calculators/A&P                         Patient presents with blood in his urine.  He is also been having some increased bladder spasms.  His urine is intermittently bloody.  It is draining well.  No signs of obstruction.  His bladder was irrigated and the clots cleared.  His urine is consistent with infection.  It was sent for culture.  He was given dose of Rocephin in the ED.  His labs show an elevated WBC count but otherwise nonconcerning.  He is afebrile and otherwise well-appearing.  I spoke with his  sister.  He has full-time caregivers at home.  She states that he has been a little bit confused today but otherwise at baseline.  They were concerned that he may have had a urinary tract infection.  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  He does not have a recent DVT.  I discussed with her holding the Xarelto for the next 2 days and then restarting it.  I will start him on Keflex.  He was advised to follow-up with [facility] if his symptoms continue or return here as needed if he has any worsening symptoms.  I discussed this with his sister and that he is in palliative care and would prefer to stay at home.  I think it is reasonable to give a trial of home care as he otherwise is well-appearing.  He however was given strict return precautions. Final Clinical Impression(s) / ED Diagnoses Final diagnoses:  Acute cystitis with hematuria    Rx / DC Orders ED Discharge Orders    None       Rolan Bucco, MD 03/05/20 Brooke Pace    Rolan Bucco, MD 03/05/20 2239

## 2020-03-05 NOTE — Discharge Instructions (Addendum)
Stop taking your Xarelto for the next 2 days and then restart it.  Take the antibiotics as prescribed.  Return here as needed if you have any worsening symptoms.  Follow-up with the urologist if you continue to have blood in your urine.

## 2020-03-05 NOTE — ED Triage Notes (Signed)
Per EMS- patient is from home. Patient c/o seeing blood in  his foley cath and having generalized abdominal pain x 1 hour ago. Patient is on blood thinners.

## 2020-03-06 NOTE — ED Notes (Signed)
Called sister 5x times with no answer. AVS reviewed with pt, and told to call ED if they had any answers.

## 2020-03-08 LAB — URINE CULTURE: Culture: >=100000 — AB

## 2020-03-09 ENCOUNTER — Telehealth: Payer: Self-pay

## 2020-03-09 NOTE — Telephone Encounter (Signed)
Post ED Visit - Positive Culture Follow-up  Culture report reviewed by antimicrobial stewardship pharmacist: [facility] []  , Pharm.D. []  Enzo Bi, Pharm.D., BCPS AQ-ID []  , Pharm.D., BCPS []  Celedonio Miyamoto, Pharm.D., BCPS []  [place], Garvin Fila.D., BCPS, AAHIVP []  , Pharm.D., BCPS, AAHIVP []  Georgina Pillion, PharmD, BCPS []  , PharmD, BCPS []  [place], PharmD, BCPS []  [address], PharmD []  , PharmD, BCPS []  Estella Husk, PharmD  Pharmacy Team []  Lysle Pearl, PharmD []  , PharmD []  Phillips Climes, PharmD []  , Rph []  Agapito Games) , PharmD []  Verlan Friends, PharmD []  , PharmD []  Mervyn Gay, PharmD []  , PharmD []  Vinnie Level, PharmD []  [facility], PharmD []  , PharmD []  Len Childs, PharmD  Pharm D Positive urine culture Treated with Cephalexin, organism sensitive to the same and no further patient follow-up is required at this time.  Greer Pickerel 03/09/2020, 9:58 AM

## 2020-03-11 ENCOUNTER — Telehealth: Payer: Self-pay

## 2020-03-11 NOTE — Telephone Encounter (Signed)
Volunteer check in call made for palliative care: Doing well 

## 2020-03-14 ENCOUNTER — Emergency Department (HOSPITAL_COMMUNITY): Payer: Medicare Other

## 2020-03-14 ENCOUNTER — Inpatient Hospital Stay (HOSPITAL_COMMUNITY): Payer: Medicare Other

## 2020-03-14 ENCOUNTER — Inpatient Hospital Stay (HOSPITAL_COMMUNITY)
Admission: EM | Admit: 2020-03-14 | Discharge: 2020-03-29 | DRG: 698 | Disposition: E | Payer: Medicare Other | Attending: Internal Medicine | Admitting: Internal Medicine

## 2020-03-14 ENCOUNTER — Other Ambulatory Visit: Payer: Self-pay

## 2020-03-14 DIAGNOSIS — Z20822 Contact with and (suspected) exposure to covid-19: Secondary | ICD-10-CM | POA: Diagnosis present

## 2020-03-14 DIAGNOSIS — J9601 Acute respiratory failure with hypoxia: Secondary | ICD-10-CM | POA: Diagnosis present

## 2020-03-14 DIAGNOSIS — Z79899 Other long term (current) drug therapy: Secondary | ICD-10-CM

## 2020-03-14 DIAGNOSIS — G928 Other toxic encephalopathy: Secondary | ICD-10-CM | POA: Diagnosis present

## 2020-03-14 DIAGNOSIS — F419 Anxiety disorder, unspecified: Secondary | ICD-10-CM | POA: Diagnosis present

## 2020-03-14 DIAGNOSIS — Z7401 Bed confinement status: Secondary | ICD-10-CM

## 2020-03-14 DIAGNOSIS — A4152 Sepsis due to Pseudomonas: Secondary | ICD-10-CM | POA: Diagnosis present

## 2020-03-14 DIAGNOSIS — M2042 Other hammer toe(s) (acquired), left foot: Secondary | ICD-10-CM | POA: Diagnosis present

## 2020-03-14 DIAGNOSIS — A419 Sepsis, unspecified organism: Secondary | ICD-10-CM | POA: Diagnosis not present

## 2020-03-14 DIAGNOSIS — Z981 Arthrodesis status: Secondary | ICD-10-CM

## 2020-03-14 DIAGNOSIS — R569 Unspecified convulsions: Secondary | ICD-10-CM | POA: Diagnosis present

## 2020-03-14 DIAGNOSIS — E43 Unspecified severe protein-calorie malnutrition: Secondary | ICD-10-CM | POA: Diagnosis present

## 2020-03-14 DIAGNOSIS — I10 Essential (primary) hypertension: Secondary | ICD-10-CM | POA: Diagnosis present

## 2020-03-14 DIAGNOSIS — Y846 Urinary catheterization as the cause of abnormal reaction of the patient, or of later complication, without mention of misadventure at the time of the procedure: Secondary | ICD-10-CM | POA: Diagnosis present

## 2020-03-14 DIAGNOSIS — Z7189 Other specified counseling: Secondary | ICD-10-CM | POA: Diagnosis not present

## 2020-03-14 DIAGNOSIS — Z7901 Long term (current) use of anticoagulants: Secondary | ICD-10-CM | POA: Diagnosis not present

## 2020-03-14 DIAGNOSIS — Z515 Encounter for palliative care: Secondary | ICD-10-CM

## 2020-03-14 DIAGNOSIS — R0902 Hypoxemia: Secondary | ICD-10-CM

## 2020-03-14 DIAGNOSIS — R64 Cachexia: Secondary | ICD-10-CM | POA: Diagnosis present

## 2020-03-14 DIAGNOSIS — N390 Urinary tract infection, site not specified: Secondary | ICD-10-CM | POA: Diagnosis present

## 2020-03-14 DIAGNOSIS — R5383 Other fatigue: Secondary | ICD-10-CM | POA: Diagnosis not present

## 2020-03-14 DIAGNOSIS — D75839 Thrombocytosis, unspecified: Secondary | ICD-10-CM | POA: Diagnosis present

## 2020-03-14 DIAGNOSIS — R531 Weakness: Secondary | ICD-10-CM | POA: Diagnosis not present

## 2020-03-14 DIAGNOSIS — Z86718 Personal history of other venous thrombosis and embolism: Secondary | ICD-10-CM | POA: Diagnosis not present

## 2020-03-14 DIAGNOSIS — J690 Pneumonitis due to inhalation of food and vomit: Secondary | ICD-10-CM | POA: Diagnosis present

## 2020-03-14 DIAGNOSIS — Z885 Allergy status to narcotic agent status: Secondary | ICD-10-CM | POA: Diagnosis not present

## 2020-03-14 DIAGNOSIS — Z681 Body mass index (BMI) 19 or less, adult: Secondary | ICD-10-CM | POA: Diagnosis not present

## 2020-03-14 DIAGNOSIS — R6521 Severe sepsis with septic shock: Secondary | ICD-10-CM | POA: Diagnosis present

## 2020-03-14 DIAGNOSIS — N319 Neuromuscular dysfunction of bladder, unspecified: Secondary | ICD-10-CM | POA: Diagnosis not present

## 2020-03-14 DIAGNOSIS — G8250 Quadriplegia, unspecified: Secondary | ICD-10-CM | POA: Diagnosis present

## 2020-03-14 DIAGNOSIS — Z66 Do not resuscitate: Secondary | ICD-10-CM | POA: Diagnosis present

## 2020-03-14 DIAGNOSIS — T83518A Infection and inflammatory reaction due to other urinary catheter, initial encounter: Principal | ICD-10-CM | POA: Diagnosis present

## 2020-03-14 DIAGNOSIS — M2041 Other hammer toe(s) (acquired), right foot: Secondary | ICD-10-CM | POA: Diagnosis present

## 2020-03-14 DIAGNOSIS — R4182 Altered mental status, unspecified: Secondary | ICD-10-CM | POA: Diagnosis not present

## 2020-03-14 DIAGNOSIS — E785 Hyperlipidemia, unspecified: Secondary | ICD-10-CM | POA: Diagnosis present

## 2020-03-14 DIAGNOSIS — Z87891 Personal history of nicotine dependence: Secondary | ICD-10-CM

## 2020-03-14 DIAGNOSIS — I4510 Unspecified right bundle-branch block: Secondary | ICD-10-CM | POA: Diagnosis present

## 2020-03-14 DIAGNOSIS — M2450 Contracture, unspecified joint: Secondary | ICD-10-CM | POA: Diagnosis present

## 2020-03-14 DIAGNOSIS — B91 Sequelae of poliomyelitis: Secondary | ICD-10-CM

## 2020-03-14 DIAGNOSIS — R54 Age-related physical debility: Secondary | ICD-10-CM | POA: Diagnosis present

## 2020-03-14 DIAGNOSIS — R1310 Dysphagia, unspecified: Secondary | ICD-10-CM | POA: Diagnosis present

## 2020-03-14 LAB — CBC WITH DIFFERENTIAL/PLATELET
Abs Immature Granulocytes: 0.16 10*3/uL — ABNORMAL HIGH (ref 0.00–0.07)
Basophils Absolute: 0.1 10*3/uL (ref 0.0–0.1)
Basophils Relative: 1 %
Eosinophils Absolute: 0.6 10*3/uL — ABNORMAL HIGH (ref 0.0–0.5)
Eosinophils Relative: 5 %
HCT: 41.2 % (ref 39.0–52.0)
Hemoglobin: 12.5 g/dL — ABNORMAL LOW (ref 13.0–17.0)
Immature Granulocytes: 1 %
Lymphocytes Relative: 39 %
Lymphs Abs: 5.3 10*3/uL — ABNORMAL HIGH (ref 0.7–4.0)
MCH: 30.9 pg (ref 26.0–34.0)
MCHC: 30.3 g/dL (ref 30.0–36.0)
MCV: 101.7 fL — ABNORMAL HIGH (ref 80.0–100.0)
Monocytes Absolute: 1.2 10*3/uL — ABNORMAL HIGH (ref 0.1–1.0)
Monocytes Relative: 9 %
Neutro Abs: 6.3 10*3/uL (ref 1.7–7.7)
Neutrophils Relative %: 45 %
Platelets: 429 10*3/uL — ABNORMAL HIGH (ref 150–400)
RBC: 4.05 MIL/uL — ABNORMAL LOW (ref 4.22–5.81)
RDW: 13.7 % (ref 11.5–15.5)
WBC: 13.6 10*3/uL — ABNORMAL HIGH (ref 4.0–10.5)
nRBC: 0 % (ref 0.0–0.2)

## 2020-03-14 LAB — D-DIMER, QUANTITATIVE: D-Dimer, Quant: 2.48 ug/mL-FEU — ABNORMAL HIGH (ref 0.00–0.50)

## 2020-03-14 LAB — I-STAT ARTERIAL BLOOD GAS, ED
Acid-base deficit: 1 mmol/L (ref 0.0–2.0)
Bicarbonate: 24.4 mmol/L (ref 20.0–28.0)
Calcium, Ion: 1.21 mmol/L (ref 1.15–1.40)
HCT: 37 % — ABNORMAL LOW (ref 39.0–52.0)
Hemoglobin: 12.6 g/dL — ABNORMAL LOW (ref 13.0–17.0)
O2 Saturation: 100 %
Patient temperature: 99.7
Potassium: 3.7 mmol/L (ref 3.5–5.1)
Sodium: 135 mmol/L (ref 135–145)
TCO2: 26 mmol/L (ref 22–32)
pCO2 arterial: 42.3 mmHg (ref 32.0–48.0)
pH, Arterial: 7.372 (ref 7.350–7.450)
pO2, Arterial: 197 mmHg — ABNORMAL HIGH (ref 83.0–108.0)

## 2020-03-14 LAB — RESPIRATORY PANEL BY RT PCR (FLU A&B, COVID)
Influenza A by PCR: NEGATIVE
Influenza B by PCR: NEGATIVE
SARS Coronavirus 2 by RT PCR: NEGATIVE

## 2020-03-14 LAB — BASIC METABOLIC PANEL
Anion gap: 13 (ref 5–15)
BUN: 15 mg/dL (ref 8–23)
CO2: 22 mmol/L (ref 22–32)
Calcium: 9.8 mg/dL (ref 8.9–10.3)
Chloride: 99 mmol/L (ref 98–111)
Creatinine, Ser: 0.43 mg/dL — ABNORMAL LOW (ref 0.61–1.24)
GFR, Estimated: >60 mL/min (ref >60)
Glucose, Bld: 147 mg/dL — ABNORMAL HIGH (ref 70–99)
Potassium: 4.9 mmol/L (ref 3.5–5.1)
Sodium: 134 mmol/L — ABNORMAL LOW (ref 135–145)

## 2020-03-14 LAB — URINALYSIS, ROUTINE W REFLEX MICROSCOPIC: Leukocytes,Ua: NONE SEEN — AB

## 2020-03-14 LAB — URINALYSIS, MICROSCOPIC (REFLEX)
RBC / HPF: >50 RBC/hpf (ref 0–5)
WBC, UA: >50 WBC/hpf (ref 0–5)

## 2020-03-14 LAB — RAPID URINE DRUG SCREEN, HOSP PERFORMED
Amphetamines: NOT DETECTED
Barbiturates: NOT DETECTED
Benzodiazepines: NOT DETECTED
Cocaine: NOT DETECTED
Opiates: NOT DETECTED
Tetrahydrocannabinol: NOT DETECTED

## 2020-03-14 LAB — FOLATE: Folate: 47.2 ng/mL (ref >5.9)

## 2020-03-14 LAB — TSH: TSH: 0.7 u[IU]/mL (ref 0.350–4.500)

## 2020-03-14 LAB — LACTIC ACID, PLASMA
Lactic Acid, Venous: 4 mmol/L (ref 0.5–1.9)
Lactic Acid, Venous: 1 mmol/L (ref 0.5–1.9)

## 2020-03-14 LAB — VITAMIN B12: Vitamin B-12: 995 pg/mL — ABNORMAL HIGH (ref 180–914)

## 2020-03-14 LAB — PROTIME-INR
INR: 1.4 — ABNORMAL HIGH (ref 0.8–1.2)
Prothrombin Time: 17 seconds — ABNORMAL HIGH (ref 11.4–15.2)

## 2020-03-14 LAB — MAGNESIUM: Magnesium: 2.2 mg/dL (ref 1.7–2.4)

## 2020-03-14 LAB — APTT: aPTT: 33 seconds (ref 24–36)

## 2020-03-14 LAB — AMMONIA: Ammonia: 21 umol/L (ref 9–35)

## 2020-03-14 MED ORDER — LEVETIRACETAM IN NACL 1000 MG/100ML IV SOLN
1000.0000 mg | Freq: Once | INTRAVENOUS | Status: AC
  Administered 2020-03-14: 1000 mg via INTRAVENOUS
  Filled 2020-03-14: qty 100

## 2020-03-14 MED ORDER — SODIUM CHLORIDE 0.9 % IV BOLUS
500.0000 mL | Freq: Once | INTRAVENOUS | Status: AC
  Administered 2020-03-14: 500 mL via INTRAVENOUS

## 2020-03-14 MED ORDER — HEPARIN (PORCINE) 25000 UT/250ML-% IV SOLN
650.0000 [IU]/h | INTRAVENOUS | Status: DC
  Administered 2020-03-14: 1000 [IU]/h via INTRAVENOUS
  Filled 2020-03-14: qty 250

## 2020-03-14 MED ORDER — SODIUM CHLORIDE 0.9 % IV SOLN
1.0000 g | Freq: Once | INTRAVENOUS | Status: AC
  Administered 2020-03-14: 1 g via INTRAVENOUS
  Filled 2020-03-14: qty 10

## 2020-03-14 MED ORDER — ACETAMINOPHEN 650 MG RE SUPP
650.0000 mg | Freq: Four times a day (QID) | RECTAL | Status: DC | PRN
  Administered 2020-03-16: 650 mg via RECTAL
  Filled 2020-03-14: qty 1

## 2020-03-14 MED ORDER — SODIUM CHLORIDE 0.9 % IV SOLN
3.0000 g | Freq: Four times a day (QID) | INTRAVENOUS | Status: DC
  Administered 2020-03-14 – 2020-03-16 (×6): 3 g via INTRAVENOUS
  Filled 2020-03-14 (×2): qty 3
  Filled 2020-03-14: qty 8
  Filled 2020-03-14: qty 3
  Filled 2020-03-14 (×2): qty 8
  Filled 2020-03-14 (×2): qty 3
  Filled 2020-03-14 (×2): qty 8

## 2020-03-14 MED ORDER — SODIUM CHLORIDE 0.9 % IV SOLN
500.0000 mg | Freq: Once | INTRAVENOUS | Status: AC
  Administered 2020-03-14: 500 mg via INTRAVENOUS
  Filled 2020-03-14: qty 500

## 2020-03-14 MED ORDER — SODIUM CHLORIDE 0.9 % IV SOLN: INTRAVENOUS | Status: DC

## 2020-03-14 MED ORDER — LORAZEPAM 2 MG/ML IJ SOLN
INTRAMUSCULAR | Status: AC
  Filled 2020-03-14: qty 1

## 2020-03-14 MED ORDER — ENOXAPARIN SODIUM 40 MG/0.4ML ~~LOC~~ SOLN: 40.0000 mg | SUBCUTANEOUS | Status: DC

## 2020-03-14 MED ORDER — ONDANSETRON HCL 4 MG/2ML IJ SOLN: 4.0000 mg | Freq: Four times a day (QID) | INTRAMUSCULAR | Status: DC | PRN

## 2020-03-14 MED ORDER — LORAZEPAM 2 MG/ML IJ SOLN
INTRAMUSCULAR | Status: AC
  Administered 2020-03-14: 0.5 mg via INTRAVENOUS
  Filled 2020-03-14: qty 1

## 2020-03-14 MED ORDER — ONDANSETRON HCL 4 MG PO TABS: 4.0000 mg | ORAL_TABLET | Freq: Four times a day (QID) | ORAL | Status: DC | PRN

## 2020-03-14 MED ORDER — LORAZEPAM 2 MG/ML IJ SOLN: 0.5000 mg | Freq: Once | INTRAMUSCULAR | Status: AC

## 2020-03-14 MED ORDER — ACETAMINOPHEN 325 MG PO TABS: 650.0000 mg | ORAL_TABLET | Freq: Four times a day (QID) | ORAL | Status: DC | PRN

## 2020-03-14 NOTE — Progress Notes (Signed)
Elink monitoring complete. Have communicated w/ RNs r/t fluid resuscitation and they understood that the provider did not want to give the entire volume. They will address in note. They have had difficulty accessing for f/u [facility] but are still pursueing.

## 2020-03-14 NOTE — ED Notes (Signed)
MD aware of lactic.  No additional fluids ordered.

## 2020-03-14 NOTE — Progress Notes (Addendum)
Floor coverage progress note  Patient admitted earlier today for acute metabolic encephalopathy (?  Seizures versus ?infectious cause) and for sepsis/acute hypoxemic respiratory failure secondary to aspiration pneumonia.  It is reported that he had a choking episode while eating breakfast this morning.  COVID-19 negative.  Paged by nursing staff due to concern for worsening hypoxemia.  It is reported that patient desatted to 33% on 6 L supplemental oxygen, placed on 15 L via nonrebreather and sats improved to 96%.  Patient was seen and examined at bedside.  Unable to give any history.  Tachycardic with heart rate in the 130s.  Satting 99% on 15 L via nonrebreather.  Tachypneic with respiratory rate in the upper 20s to 30s.  Equal air entry bilaterally upon auscultation of the lungs, no wheezing or rales appreciated.  No lower extremity edema.  Repeat chest x-ray showing patchy bilateral perihilar opacities concerning for pneumonia.  ABG reassuring.  Weaned down to 10 L via NRB and continues to maintain sats above 98%.  EKG showing sinus tachycardia and ST depressions which could possibly be rate related.  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  Will check D-dimer level, if is elevated CT angiogram to rule out PE.  Continue heparin for anticoagulation. -Keep n.p.o., aspiration precautions -Not a candidate for NIPPV -Continue supplemental oxygen via nonrebreather, wean as tolerated -I had a goals of care discussion with the patient's sister Caid Radin [phone]) over the phone.  Sister states if the patient's respiratory status declined, he would not want intubation or mechanical ventilation.  She confirms that the patient is DNR/DNI.  She wants to continue medical  management at this time but has stated that she understands that if the patient's respiratory status declined, he will likely not survive. -It seems palliative care was consulted by admitting MD and tried to contact the patient's sister but were not able to reach her over the phone at that time.  Please reengage palliative care service in the morning.

## 2020-03-14 NOTE — Progress Notes (Signed)
[facility] ([facility]) Community Based Palliative Care       This patient is enrolled in our palliative care services in the community.  [facility] will continue to follow for any discharge planning needs and to coordinate continuation of palliative care.   If you have questions or need assistance, please call [phone] or contact the hospital Liaison listed on AMION.     Thank you for the opportunity to participate in this patient's care.     Chrislyn King, BSN, RN [facility] Hospital Liaison   [phone]   

## 2020-03-14 NOTE — TOC Progression Note (Signed)
Transition of Care [facility]) - Progression Note    Patient Details  Name: Damon Colon MRN: [id number] Date of Birth: May 29, 1941  [facility] CM/SW Contact  Lockie Pares, RN Phone Number: March 18, 2020, 11:32 AM  Clinical Narrative:     Patient is with [facility] contacted them for family number contacts. They called back and stated they are aware he is here and are following him. We have contacted sister, she is at his home. Reached out to primary MD here at [facility] to let her know that palliative will be following with patient.     Barriers to Discharge: Family Issues  Expected Discharge Plan and Services                                                 Social Determinants of Health (SDOH) Interventions    Readmission Risk Interventions No flowsheet data found.

## 2020-03-14 NOTE — TOC Progression Note (Signed)
Transition of Care [facility]) - Progression Note    Patient Details  Name: Damon Colon MRN: [id number] Date of Birth: [birth year]  Transition of [facility]) CM/SW Contact  Damon Genta, LCSW Phone Number: 03/12/2020, 11:34 AM  Clinical Narrative:    CSW was able to establish patient's provided phone number [phone] is a landline to his residence. CSW noted patient's spouse is currently deceased, but his sister Damon Colon is at this phone number and available. Damon Colon reports she is also currently power of attorney. CSW updated RN CM. This number is currently the best number to reach Damon Colon at per her request.    Barriers to Discharge: Family Issues  Expected Discharge Plan and Services                                                 Social Determinants of Health (SDOH) Interventions    Readmission Risk Interventions No flowsheet data found.

## 2020-03-14 NOTE — Progress Notes (Signed)
LTM EEG hooked up and running - no initial skin breakdown - push button tested - neuro notified. [facility] notified  

## 2020-03-14 NOTE — Progress Notes (Signed)
ANTICOAGULATION CONSULT NOTE - Initial Consult  Pharmacy Consult for heparin Indication: Hx DVTs  Allergies  Allergen Reactions  . Codeine Other (See Comments)    Reaction:  Somnolence     Patient Measurements:   Heparin Dosing Weight: TBW  Vital Signs: Temp: 97 F (36.1 C) (10/17 1134) Temp Source: Axillary (10/17 1134) BP: 128/88 (10/17 1245) Pulse Rate: 86 (10/17 1245)  Labs: Recent Labs    03/13/2020 1015 03/28/2020 1042  HGB 12.5*  --   HCT 41.2  --   PLT 429*  --   LABPROT  --  17.0*  INR  --  1.4*  CREATININE 0.43*  --     Estimated Creatinine Clearance: 71.3 mL/min (A) (by C-G formula based on SCr of 0.43 mg/dL (L)).   Medical History: Past Medical History:  Diagnosis Date  . Arthritis   . Bruises easily   . Fever    102  . Hypertension   . Paraplegia (HCC)   . Polio   . 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 Wears glasses    Assessment: 41 YOM presenting with AMS, on Xarelto PTA with last dose taken 10/16 ~1800.  Will follow aPTT until Anto-Xa level correlates.    Goal of Therapy:  Heparin level 0.3-0.7 units/ml aPTT 66-102 seconds Monitor platelets by anticoagulation protocol: Yes   Plan:  Heparin gtt at 1000 units/hr starting 24h from last Xarelto dose, no bolus F/u 8 hour aPTT F/u ability to transition back to PO  Daylene Posey, PharmD Clinical Pharmacist ED Pharmacist Phone # [phone] 03/23/2020 2:53 PM

## 2020-03-14 NOTE — ED Notes (Signed)
Called and LM on sister's phone.

## 2020-03-14 NOTE — Consult Note (Signed)
Consultation Note Date: Mar 28, 2020   Patient Name: Damon Colon  DOB: 1941-03-03  MRN: [id number]  Age / Sex: 79 y.o., male  PCP: Catha Gosselin, MD Referring Physician: Ollen Bowl, MD  Reason for Consultation: Establishing goals of care  HPI/Patient Profile: 79 y.o. 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  ED Course: Upon arrival to ED: Patient temperature was noted 36.1, hypoxemic requiring nonrebreather, leukocytosis of 13.6-chronic, MCV: 101.7, platelet: 427, lactic acid of 4.0, CT head came back negative, UA, UC, BC: Pending.  COVID-19 negative.  Chest x-ray concerning for right lung pneumonia concerning for aspiration.  EDP consulted neurology as patient had eye deviation to the right-concerning for seizures like episode.  Patient was given IV fluid of 500 cc, Ativan, Keppra of 1000 mg, Rocephin and azithromycin in ED.  [facility] hospitalist consulted for admission for altered mental status/aspiration pneumonia.  Of note, patient is being followed by outpatient Palliative Care with [facility].  Family face treatment option decisions, advanced directive decisions, and anticipatory care needs.   Clinical Assessment and Goals of Care: I have reviewed medical records including EPIC notes, labs, and imaging. Received report from attending provider and primary RN.  Went to visit patient at bedside - no family/visitors present. Patient was lying in bed - he did not wake to voice or gentle touch and was unable to participate in conversation. No signs or non-verbal gestures of pain or discomfort noted. He is on 15L  non-rebreather. Patient is very ill and frail appearing.  Per previous outpatient Palliative Care notes: patient has a living will, he wanted to avoid hospitalization if possible, his goal was to attempt longevity with quality of life at home consisting of 24 hour caregivers.   Patient has history of dysphagia, which makes him high risk for recurrent aspiration event.  4:09 PM Attempted to call sister/Nancy, no answer - left confidential voicemail and PMT phone number for return call.  6:28 PM Attempted to call sister/Nancy again, no answer - left confidential voicemail and PMT phone number for return call. Stated PMT would attempt to reach out again tomorrow if we could not speak with her today or she could call tomorrow when available.   Primary Decision Maker: NEXT OF KIN - Blanchie Dessert - only emergency contact listed in EHR    SUMMARY OF RECOMMENDATIONS:  Was not able to get in contact with sister/Nancy today to discuss GOC  PMT will continue to reach out to [name] and have GOC conversation pending clinical course  PMT will continue to follow holistically   Code Status/Advance Care Planning:  DNR   Palliative Prophylaxis:   Aspiration, Bowel Regimen, Delirium Protocol, Frequent Pain Assessment, Oral Care and Turn Reposition  Additional Recommendations (Limitations, Scope, Preferences):  Full Scope Treatment until full  GOC conversation can be completed  Psycho-social/Spiritual:   Desire for further Chaplaincy support: was not able to assess  Prognosis:   < 2 weeks  Discharge Planning: To Be Determined      Primary Diagnoses: Present on Admission: . Acute respiratory failure with hypoxia (HCC) . (Resolved) Acute hypoxemic respiratory failure (HCC) . Hypertension . Aspiration pneumonia (HCC) . Thrombocytosis . Quadriplegia and quadriparesis (HCC) . Neurogenic bladder disorder   I have reviewed the medical record, interviewed the patient and family,  and examined the patient. The following aspects are pertinent.  Past Medical History:  Diagnosis Date  . Arthritis   . Bruises easily   . Fever    102  . Hypertension   . Paraplegia (HCC)   . Polio   . 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 Wears glasses    Social History   Socioeconomic History  . Marital status: Single    Spouse name: Not on file  . Number of children: 0  . Years of education: College  . Highest education level: Not on file  Occupational History  . Not on file  Tobacco Use  . Smoking status: Former Games developer  . Smokeless tobacco: Never Used  . Tobacco comment: 1981  Vaping Use  . Vaping Use: Never used  Substance and Sexual Activity  . Alcohol use: Yes    Comment: occa  . Drug use: No  . Sexual activity: Not on file  Other Topics Concern  . Not on file  Social History Narrative   Patient is single and lives with his sister.   Patient is retired.   Patient drinks a half cup to one cup of caffeine daily.   Patient is right-handed.   Patient has a college education.   Social Determinants of Health   Financial Resource Strain:   . Difficulty of Paying Living Expenses: Not on file  Food Insecurity:   . Worried About Programme researcher, broadcasting/film/video in the Last Year: Not on file  . Ran Out of Food in the Last Year: Not on file  Transportation Needs:   . Lack of Transportation (Medical): Not on file  . Lack of Transportation (Non-Medical): Not on file  Physical Activity:   . Days of Exercise per Week: Not on file  . Minutes of Exercise per Session: Not on file  Stress:   . Feeling of Stress : Not on file  Social Connections:   . Frequency of Communication with Friends and Family: Not on file  . Frequency of Social Gatherings with Friends and Family: Not on file  . Attends Religious Services: Not on file  . Active Member of Clubs or Organizations: Not on file  . Attends Banker Meetings: Not on file  . Marital Status: Not on file    No family history on file. Scheduled Meds: Continuous Infusions: . ampicillin-sulbactam (UNASYN) IV    . heparin     PRN Meds:.acetaminophen **OR** acetaminophen, ondansetron **OR** ondansetron (ZOFRAN) IV Medications Prior to Admission:  Prior to Admission medications   Medication Sig Start Date End Date Taking? Authorizing Provider  acetaminophen (TYLENOL) 500 MG tablet Take 500 mg by mouth every 6 (six) hours as needed for mild pain, moderate pain or headache.    Yes [provider]  Multiple Vitamin (MULTIVITAMIN WITH MINERALS) TABS tablet Take 1 tablet by mouth daily.   Yes [provider]  Nutritional Supplements (ENSURE HIGH PROTEIN) LIQD Take 1 Container by mouth 3 (three) times daily.  Yes [provider]  oxybutynin (DITROPAN) 5 MG tablet Take 5 mg by mouth daily as needed for bladder spasms.  06/25/19  Yes [provider]  polyethylene glycol powder (MIRALAX) 17 GM/SCOOP powder Start taking 1 capful 3 times a day. Slowly cut back as needed until you have normal bowel movements. Patient taking differently: Take 17 g by mouth 2 (two) times daily.  07/16/19  Yes Cardama, Amadeo Garnet, MD  pravastatin (PRAVACHOL) 80 MG tablet Take 80 mg by mouth every evening.  10/05/18  Yes [provider]  traMADol (ULTRAM) 50 MG tablet Take 50 mg by mouth every 6 (six) hours as needed for moderate pain.    Yes [provider]  XARELTO 10 MG TABS tablet Take 10 mg by mouth every evening.  03/01/20  Yes [provider]  cephALEXin (KEFLEX) 500 MG capsule Take 1 capsule (500 mg total) by mouth 2 (two) times daily. Patient not taking: Reported on Apr 05, 2020 03/05/20   Rolan Bucco, MD   Allergies  Allergen Reactions  . Codeine Other (See Comments)    Reaction:  Somnolence    Review of Systems  Unable to perform ROS: Acuity of condition    Physical Exam Constitutional:      General: He is not in acute distress.    Appearance: He  is ill-appearing.     Comments: Ill and frail appearing, cachectic   Pulmonary:     Effort: No respiratory distress.  Skin:    General: Skin is cool and dry.     Coloration: Skin is pale.  Neurological:     Mental Status: He is lethargic.     Motor: Weakness present.  Psychiatric:        Speech: He is noncommunicative.        Cognition and Memory: Cognition is impaired. Memory is impaired.     Vital Signs: BP 128/88   Pulse 86   Temp (!) 97 F (36.1 C) (Axillary)   Resp 14   SpO2 100%          SpO2: SpO2: 100 % O2 Device:SpO2: 100 % O2 Flow Rate: .O2 Flow Rate (L/min): 15 L/min  IO: Intake/output summary:   Intake/Output Summary (Last 24 hours) at 05-Apr-2020 1608 Last data filed at 05-Apr-2020 1207 Gross per 24 hour  Intake 347.36 ml  Output --  Net 347.36 ml    LBM:   Baseline Weight:   Most recent weight:       Palliative Assessment/Data: PPS 10%     Time In: 1550 Time Out: 1615 Time In: 1825 Time Out: 1830 Time Total: 30 minutes  Greater than 50%  of this time was spent counseling and coordinating care related to the above assessment and plan.  Signed by: Haskel Khan, NP   Please contact Palliative Medicine Team phone at [phone] for questions and concerns.  For individual provider: See Loretha Stapler

## 2020-03-14 NOTE — ED Notes (Signed)
Patient became hypoxic at 33% nrb applied and [place] flow increased. Attending made aware and admitting provider on call paged at this time.

## 2020-03-14 NOTE — TOC Initial Note (Signed)
Transition of Care [facility]) - Initial/Assessment Note    Patient Details  Name: Damon Colon MRN: [id number] Date of Birth: November 26, 1940  Transition of Care [facility]) CM/SW Contact:    Lockie Pares, RN Phone Number: 03/22/2020, 11:19 AM  Clinical Narrative:                  Pateint choking on eggs at home, home health started CPR, ROSC achieved. Patient is a DNR, palliative care was following at home. He is currently on 100% NRB, not speaking, unresponsive, breathing, Emergency contact, sister called [phone]. Left confidential message of urgency to return call. Need to know what treatments he would want, such as antibiotics, etc. Will work with CSW and continue to review previous notes for clarity.   Barriers to Discharge: Family Issues   Patient Goals and CMS Choice        Expected Discharge Plan and Services                                                Prior Living Arrangements/Services                       Activities of Daily Living      Permission Sought/Granted                  Emotional Assessment              Admission diagnosis:  Unresponsive Patient Active Problem List   Diagnosis Date Noted  . Neurogenic bladder disorder 04/02/2014  . 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 Postpoliomyelitis muscular atrophy 09/17/2013   PCP:  Catha Gosselin, MD Pharmacy:   [facility] [place], [place] - [address] [place] [address] Phone: [phone] Fax: [phone]     Social Determinants of Health (SDOH) Interventions    Readmission Risk Interventions No flowsheet data found.

## 2020-03-14 NOTE — Procedures (Addendum)
Patient Name: Damon Colon  MRN: [id number]  Epilepsy Attending: Charlsie Quest  Referring Physician/Provider: Dr Erick Blinks Date: 03/19/2020  Duration: 24.36 mins  Patient history: 79yo M with ams and right gaze deviation. EEf to evaluate for seizure.  Level of alertness: comatose  AEDs during EEG study: None  Technical aspects: This EEG study was done with scalp electrodes positioned according to the 10-20 International system of electrode placement. Electrical activity was acquired at a sampling rate of 500Hz  and reviewed with a high frequency filter of 70Hz  and a low frequency filter of 1Hz . EEG data were recorded continuously and digitally stored.   Description: No posterior dominant rhythm was seen. EEG showed continuous generalized 3 to 6 Hz theta-delta slowing. Hyperventilation and photic stimulation were not performed.     ABNORMALITY -Continuous slow, generalized  IMPRESSION: This study is suggestive of severe diffuse encephalopathy, nonspecific etiology. No seizures or epileptiform discharges were seen throughout the recording.  [name] 

## 2020-03-14 NOTE — H&P (Signed)
History and Physical    Damon Colon UUV:[id number] DOB: 10/03/1940 DOA: 04/22/20  PCP: Catha GosselinLittle, Kevin, MD  Patient coming from: home  I have personally briefly [facility]  Chief Complaint: Altered mental status  HPI: Damon RoundsRobert B Oley is a 79 y.o. male with medical history significant of hypertension, quadriplegia secondary to underlying polio, postpolio atrophy, history of cervical stenosis/myelopathy, bladder spasm, contractures in all 4 extremities, history of DVT-on Xarelto presents to emergency department for evaluation of AMS.  History gathered from charts.  As per home health nurse: Patient was eating eggs when he choked up and became suddenly unresponsive, collapsed on the floor and he stopped breathing.  CPR was started by home health nurse.  EMS was called-patient had poor respiratory effort-was placed on nonrebreather and brought to the emergency department for further evaluation and management.  Patient is DNR.  He is followed by palliative care however he is not on hospice.  Of note: Patient presented to ER on 10/8 for possible UTI and was discharged home on Keflex.  ED Course: Upon arrival to ED: Patient temperature was noted 36.1, hypoxemic requiring nonrebreather, leukocytosis of 13.6-chronic, MCV: 101.7, platelet: 427, lactic acid of 4.0, CT head came back negative, UA, UC, BC: Pending.  COVID-19 negative.  Chest x-ray concerning for right lung pneumonia concerning for aspiration.  EDP consulted neurology as patient had eye deviation to the right-concerning for seizures like episode.  Patient was given IV fluid of 500 cc, Ativan, Keppra of 1000 mg, Rocephin and azithromycin in ED.  [facility] hospitalist consulted for admission for altered mental status/aspiration pneumonia.  Review of Systems: As per HPI otherwise negative.    Past Medical History:  Diagnosis Date  . Arthritis   . Bruises easily   . Fever    102  .  Hypertension   . Paraplegia (HCC)   . Polio   . 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 Wears glasses     Past Surgical History:  Procedure Laterality Date  . APPENDECTOMY    . COLONOSCOPY    . IR CATHETER TUBE CHANGE  09/01/2019  . NECK SURGERY     c7- titanium plates put in  . SPINE SURGERY     3-stage spine fusion  . TONSILLECTOMY AND ADENOIDECTOMY    . URETHRAL DIVERTICULECTOMY       reports that he has quit smoking. He has never used smokeless tobacco. He reports current alcohol use. He reports that he does not use drugs.  Allergies  Allergen Reactions  . Codeine Other (See Comments)    Reaction:  Somnolence     No family history on file.  Prior to Admission medications   Medication Sig Start Date End Date Taking? Authorizing Provider  acetaminophen (TYLENOL) 500 MG tablet Take 500 mg by mouth every 6 (six) hours as needed for mild pain, moderate pain or headache.     [provider]  cephALEXin (KEFLEX) 500 MG capsule Take 1 capsule (500 mg total) by mouth 2 (two) times daily. 03/05/20   Rolan BuccoBelfi, Melanie, MD  gabapentin (NEURONTIN) 300 MG capsule Take 300 mg by mouth at bedtime as needed (pain).  04/17/19   [provider]  Multiple Vitamin (MULTIVITAMIN WITH MINERALS) TABS tablet Take 1 tablet by mouth daily.    [provider]  oxybutynin (DITROPAN) 5 MG tablet Take 5 mg by mouth daily.  06/25/19   [provider]  polyethylene glycol (MIRALAX / GLYCOLAX) 17  g packet Take 17 g by mouth daily as needed for mild constipation.    [provider]  polyethylene glycol powder (MIRALAX) 17 GM/SCOOP powder Start taking 1 capful 3 times a day. Slowly cut back as needed until you have normal bowel movements. 07/16/19   Nira Conn, MD  pravastatin (PRAVACHOL) 80 MG tablet Take 80 mg by mouth daily.  10/05/18   [provider]  traMADol (ULTRAM) 50 MG tablet Take 50 mg by mouth 2 (two) times daily as needed (for  pain).     [provider]  XARELTO 10 MG TABS tablet Take 10 mg by mouth daily. 03/01/20   [provider]    Physical Exam: Vitals:   2020/04/04 1200 2020-04-04 1215 04-04-20 1230 04-Apr-2020 1245  BP: (!) 133/97  116/75 128/88  Pulse: 93 91 86 86  Resp: 16 17 15 14   Temp:      TempSrc:      SpO2: 100% (!) 86% 100% 100%    Constitutional: NAD, calm, comfortable, on nonrebreather-15 L.,  Appears dehydrated, thin and lean and cachectic. Eyes: PERRL, lids and conjunctivae normal ENMT: Mucous membranes are moist. Posterior pharynx clear of any exudate or lesions.Normal dentition.  Neck: normal, supple, no masses, no thyromegaly Respiratory: Coarse breath sounds noted bilaterally. Cardiovascular: Regular rate and rhythm, no murmurs / rubs / gallops. No extremity edema. 2+ pedal pulses. No carotid bruits.  Abdomen: no tenderness, no masses palpated. No hepatosplenomegaly. Bowel sounds positive.  Musculoskeletal: Contractures noted in all 4 extremities. Skin: no rashes, lesions, ulcers. No induration Neurologic: Unable to perform neurological exam as patient is not alert and not following commands.  Labs on Admission: I have personally reviewed following labs and imaging studies  CBC: Recent Labs  Lab 2020-04-04 1015  WBC 13.6*  NEUTROABS 6.3  HGB 12.5*  HCT 41.2  MCV 101.7*  PLT 429*   Basic Metabolic Panel: Recent Labs  Lab 04-04-20 1015  NA 134*  K 4.9  CL 99  CO2 22  GLUCOSE 147*  BUN 15  CREATININE 0.43*  CALCIUM 9.8   GFR: Estimated Creatinine Clearance: 71.3 mL/min (A) (by C-G formula based on SCr of 0.43 mg/dL (L)). Liver Function Tests: No results for input(s): AST, ALT, ALKPHOS, BILITOT, PROT, ALBUMIN in the last 168 hours. No results for input(s): LIPASE, AMYLASE in the last 168 hours. No results for input(s): AMMONIA in the last 168 hours. Coagulation Profile: Recent Labs  Lab 04-Apr-2020 1042  INR 1.4*   Cardiac Enzymes: No results for  input(s): CKTOTAL, CKMB, CKMBINDEX, TROPONINI in the last 168 hours. BNP (last 3 results) No results for input(s): PROBNP in the last 8760 hours. HbA1C: No results for input(s): HGBA1C in the last 72 hours. CBG: No results for input(s): GLUCAP in the last 168 hours. Lipid Profile: No results for input(s): CHOL, HDL, LDLCALC, TRIG, CHOLHDL, LDLDIRECT in the last 72 hours. Thyroid Function Tests: No results for input(s): TSH, T4TOTAL, FREET4, T3FREE, THYROIDAB in the last 72 hours. Anemia Panel: No results for input(s): VITAMINB12, FOLATE, FERRITIN, TIBC, IRON, RETICCTPCT in the last 72 hours. Urine analysis:    Component Value Date/Time   COLORURINE AMBER (A) 03/05/2020 1754   APPEARANCEUR CLOUDY (A) 03/05/2020 1754   LABSPEC 1.014 03/05/2020 1754   PHURINE 6.0 03/05/2020 1754   GLUCOSEU NEGATIVE 03/05/2020 1754   HGBUR MODERATE (A) 03/05/2020 1754   BILIRUBINUR NEGATIVE 03/05/2020 1754   KETONESUR 20 (A) 03/05/2020 1754   PROTEINUR 100 (A) 03/05/2020 1754  NITRITE POSITIVE (A) 03/05/2020 1754   LEUKOCYTESUR MODERATE (A) 03/05/2020 1754    Radiological Exams on Admission: CT Head Wo Contrast  Result Date: 03/31/20 CLINICAL DATA:  Mental status change, unknown cause. EXAM: CT HEAD WITHOUT CONTRAST TECHNIQUE: Contiguous axial images were obtained from the base of the skull through the vertex without intravenous contrast. COMPARISON:  12/06/2018 CT head and prior. FINDINGS: Brain: No acute infarct or intracranial hemorrhage. No mass lesion. No midline shift, ventriculomegaly or extra-axial fluid collection. Chronic microvascular ischemic changes, similar to prior exam. Vascular: No hyperdense vessel or unexpected calcification. Bilateral skull base atherosclerotic calcifications. Skull: Negative for fracture or focal lesion. Sinuses/Orbits: Normal orbits. Clear paranasal sinuses. No mastoid effusion. Other: None. IMPRESSION: No acute intracranial process. Chronic microvascular  ischemic changes, similar to prior exam. Electronically Signed   By: Stana Bunting M.D.   On: 03-31-2020 11:51   DG Chest Portable 1 View  Result Date: 2020-03-31 CLINICAL DATA:  Aspiration EXAM: PORTABLE CHEST 1 VIEW COMPARISON:  07/15/2019 FINDINGS: Mild patchy opacities in the right upper and lower lobes, suspicious for pneumonia. Left lung is essentially clear. No pleural effusion or pneumothorax. The heart is normal in size.  Thoracic aortic atherosclerosis. S shaped thoracolumbar scoliosis. Cervical spine fixation hardware, incompletely visualized. IMPRESSION: Mild patchy right lung opacities, suspicious for pneumonia in this patient with history of aspiration. Electronically Signed   By: Charline Bills M.D.   On: March 31, 2020 10:47    EKG: Independently reviewed.  Sinus rhythm, right atrial enlargement, right bundle branch block.  Assessment/Plan Principal Problem:   Acute respiratory failure with hypoxia (HCC) Active Problems:   Neurogenic bladder disorder   Quadriplegia and quadriparesis (HCC)   Hypertension   Aspiration pneumonia (HCC)   Thrombocytosis   Acute metabolic encephalopathy: -Seizures?  Versus infectious cause? - CT head is negative for acute stroke. -Admit patient stepdown unit for close monitoring.  On continuous pulse ox.  On telemetry. -Chest x-ray concerning for aspiration pneumonia.  Check UA, UC, BC, B12, folate, TSH, ammonia level, UDS -Neurology consulted-recommended EEG -Keppra 1000 mg IV once given in ED. -We will keep him n.p.o. -Neurochecks frequently. -Consult PT/OT/SLP -On fall/aspiration/seizure precautions  Sepsis/acute hypoxemic respiratory failure secondary to aspiration pneumonia: -Patient had a choking episode while he was eating breakfast this morning.  Reviewed chest x-ray.  Temperature of 36.1, lactic acid of 4.0, leukocytosis of 13.6.  COVID-19 negative.  Requiring 15 L via nonrebreather.  On continuous pulse ox.  We will try to  wean off of oxygen as tolerated.  Received Rocephin and azithromycin in ED.  We will start patient on Unasyn.  We will keep him n.p.o. for now. -Consult SLP for further evaluation.  Thrombocytosis: Likely reactive -Continue to monitor.  Quadriplegia: Secondary to underlying polio, post polio atrophy: -Hold p.o. home meds  History of neurogenic bladder: Has Foley catheter in place.  History of DVT: On Xarelto at home.  Hold for now.  Start on heparin as per pharmacy.  Hyperlipidemia: Hold statin  DVT prophylaxis: Heparin/SCD Code Status: DNR-confirmed with patient's sister Family Communication: None present at bedside.  Plan of care discussed with patient in length and he verbalized understanding and agreed with it.  Called patient's sister Harriett Sine who is also power of attorney and discussed about the patient.  Patient is DNR.  He is followed by palliative care however he is not on hospice care.  Disposition Plan: Likely SNF in 2 to 3 days Consults called: Neurology by EDP Admission status: Inpatient  Ollen Bowl MD [facility]  If 7PM-7AM, please contact night-coverage [email or web address] Password [email or web address]  March 22, 2020, 1:14 PM

## 2020-03-14 NOTE — Consult Note (Addendum)
NEUROLOGY CONSULTATION NOTE   Date of service: 2020/03/27 Patient Name: Damon Colon MRN:  [id number] DOB:  April 16, 1941 Reason for consult: "cocnern for seizures"  History of Present Illness  Damon Colon is a 78 y.o. 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  Patient is unable to provied any history to me, per notes he was home with care givers, eating eggs when the choked up on his food. Became suddenly unresponsive and having minimal effort with breathing. CPR was started by home health nurse but EMS noted that he had pulses upon their arrival. He was placed on a nonrebreather and brought in to the ED.  ED team noted that patient had R gaze deviation with dysconjugate gaze and we were asked to assess him. Workup so far with CTH with no acute intracranial abnormality, vitals are normal with a low  bosy temp with Tmin of 36.1 on presentation. Leykocytosis on CBC, lactate of 4.0. He was given ativan 1mg  and we were asked to evaluate.   ROS   Unable to obtain ROS 2/2 encephalopathy.  Past History   Past Medical History:  Diagnosis Date  . Arthritis   . Bruises easily   . Fever    102  . Hypertension   . Paraplegia (HCC)   . Polio   . 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 Wears glasses    Past Surgical History:  Procedure Laterality Date  . APPENDECTOMY    . COLONOSCOPY    . IR CATHETER TUBE CHANGE  09/01/2019  . NECK SURGERY     c7- titanium plates put in  . SPINE SURGERY     3-stage spine fusion  . TONSILLECTOMY AND ADENOIDECTOMY    . URETHRAL DIVERTICULECTOMY     No family history on file. Social History   Socioeconomic History  . Marital status: Single    Spouse name: Not on file  . Number of children: 0  . Years  of education: College  . Highest education level: Not on file  Occupational History  . Not on file  Tobacco Use  . Smoking status: Former 11/01/2019  . Smokeless tobacco: Never Used  . Tobacco comment: 1981  Vaping Use  . Vaping Use: Never used  Substance and Sexual Activity  . Alcohol use: Yes    Comment: occa  . Drug use: No  . Sexual activity: Not on file  Other Topics Concern  . Not on file  Social History Narrative   Patient is single and lives with his sister.   Patient is retired.   Patient drinks a half cup to one cup of caffeine daily.   Patient is right-handed.   Patient has a college education.   Social Determinants of Health   Financial Resource Strain:   . Difficulty of Paying Living Expenses: Not on file  Food Insecurity:   . Worried About Games developer in the Last Year: Not on file  . Ran Out of Food in the Last Year: Not on file  Transportation Needs:   . Lack of Transportation (Medical): Not on file  . Lack of Transportation (Non-Medical): Not on file  Physical Activity:   . Days of Exercise per Week: Not on file  . Minutes of Exercise per Session: Not on  file  Stress:   . Feeling of Stress : Not on file  Social Connections:   . Frequency of Communication with Friends and Family: Not on file  . Frequency of Social Gatherings with Friends and Family: Not on file  . Attends Religious Services: Not on file  . Active Member of Clubs or Organizations: Not on file  . Attends Banker Meetings: Not on file  . Marital Status: Not on file   Allergies  Allergen Reactions  . Codeine Other (See Comments)    Reaction:  Somnolence     Medications  (Not in a hospital admission)    Vitals   Vitals:   03/19/2020 1126 2020/03/19 1134  Pulse:  88  Resp:  14  Temp:  (!) 97 F (36.1 C)  TempSrc:  Axillary  SpO2: 100% 100%     There is no height or weight on file to calculate BMI.  Physical Exam   General: Laying comfortably in bed;  appears anorexic. HENT: Normal oropharynx and mucosa. Normal external appearance of ears and nose. Neck: Supple, no pain or tenderness CV: No JVD. No peripheral edema. Pulmonary: Symmetric Chest rise. Normal respiratory effort. Abdomen: Soft to touch, non-tender. Ext: No cyanosis, edema thin, or deformity Skin: No rash. Normal palpation of skin.  Musculoskeletal: Normal digits and nails by inspection. No clubbing.  Neurologic Examination  Mental status/Cognition: does not open eyes to voice or to nares stimulation. On non rebreather.  Brainstem reflexes: Pupils, R gaze deviationm unable to get him to cross midline with dolls eyes maneuver. Corneals: + bl Cough: unable to elicit Gag: intact but weak Symmetric grimace to nares stimuliation. Has wasting of all extremities with flexion contractures in his fingers BL and hammer toes BL.  No movement noted to nailbed pressure in any extremities Reflexes: 1+ BL in all extremities and symmetric.  Labs   CBC:  Recent Labs  Lab 2020/03/19 1015  WBC 13.6*  NEUTROABS 6.3  HGB 12.5*  HCT 41.2  MCV 101.7*  PLT 429*    Basic Metabolic Panel:  Lab Results  Component Value Date   NA 134 (L) 03/19/2020   K 4.9 03/19/20   CO2 22 2020-03-19   GLUCOSE 147 (H) 2020-03-19   BUN 15 March 19, 2020   CREATININE 0.43 (L) 03-19-2020   CALCIUM 9.8 March 19, 2020   GFRNONAA >60 March 19, 2020   GFRAA NOT CALCULATED 07/24/2019   Lipid Panel: No results found for: LDLCALC HgbA1c: No results found for: HGBA1C Urine Drug Screen: No results found for: LABOPIA, COCAINSCRNUR, LABBENZ, AMPHETMU, THCU, LABBARB  Alcohol Level No results found for: [facility]   Results for orders placed during the hospital encounter of 2020-03-19  CT Head Wo Contrast  Narrative CLINICAL DATA:  Mental status change, unknown cause.  EXAM: CT HEAD WITHOUT CONTRAST  TECHNIQUE: Contiguous axial images were obtained from the base of the skull through the vertex without intravenous  contrast.  COMPARISON:  12/06/2018 CT head and prior.  FINDINGS: Brain: No acute infarct or intracranial hemorrhage. No mass lesion. No midline shift, ventriculomegaly or extra-axial fluid collection. Chronic microvascular ischemic changes, similar to prior exam.  Vascular: No hyperdense vessel or unexpected calcification. Bilateral skull base atherosclerotic calcifications.  Skull: Negative for fracture or focal lesion.  Sinuses/Orbits: Normal orbits. Clear paranasal sinuses. No mastoid effusion.  Other: None.  IMPRESSION: No acute intracranial process.  Chronic microvascular ischemic changes, similar to prior exam.   Electronically Signed By: Stana Bunting M.D. On: Mar 19, 2020 11:51  Impression   Damon Colon is a 79 y.o. 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  Neuro exam with obtunded mentation and right gaze deviation with intact brainstem reflexes.  He was given Ativan 1 mg in the ED and was loaded with Keppra 1 g once.  Further benzos were avoided as he was DNR and concern for losing airway with further sedation.  Recommendations  - Agree with Keppra 1 g once. - We will hook him up to continuous EEG. - Recommend work-up for infectious causes for his presentation including aspiration pneumonia and for potential UTI.  Especially with leukocytosis and lactate of 4.0. -Further AED management based on continuous EEG. ______________________________________________________________________  This patient is critically ill and at significant risk of neurological worsening, death and care requires constant monitoring of vital signs, hemodynamics,respiratory and cardiac monitoring,  neurological assessment, discussion with family, other specialists and medical decision making of high complexity. I spent 40 minutes of neurocritical care time  in the care of  this patient. This was time spent independent of any time provided by nurse practitioner or PA.  Erick Blinks [facility] Pager Number [phone] 03/31/20  1:57 PM   Thank you for the opportunity to take part in the care of this patient. If you have any further questions, please contact the neurology consultation attending.  Signed,  Erick Blinks [facility] Pager Number [phone]

## 2020-03-14 NOTE — Progress Notes (Signed)
STAT EEG complete - results pending. ? ?

## 2020-03-14 NOTE — ED Provider Notes (Addendum)
[facility] EMERGENCY DEPARTMENT Provider Note   CSN: [id number] Arrival date & time:        History Chief Complaint  Patient presents with  . Altered Mental Status    Damon Colon is a 79 y.o. male.  Level 5 caveat due to altered mental status.  Patient with history of polio and muscular atrophy and paraplegia who presents after choking event with EMS.  Patient was with home care nurse eating eggs when he choked up on his food.  Became unresponsive and having minimal effort with breathing.  CPR was started briefly by home health nurse but EMS states that patient had pulses upon their arrival.  He was placed on a nonrebreather and brought to the ED.  He did not appear to be doing anything meaningful.  He had poor respiratory effort per EMS.  The history is provided by the EMS personnel.  Illness Onset quality:  Sudden Timing:  Constant Progression:  Unchanged Chronicity:  New      Past Medical History:  Diagnosis Date  . Arthritis   . Bruises easily   . Fever    102  . Hypertension   . Paraplegia (HCC)   . Polio   . 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 Wears glasses     Patient Active Problem List   Diagnosis Date Noted  . Acute respiratory failure with hypoxia (HCC) 03/08/2020  . Neurogenic bladder disorder 04/02/2014  . 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 APPENDECTOMY    . COLONOSCOPY    . IR CATHETER TUBE CHANGE  09/01/2019  . NECK SURGERY     c7- titanium plates put in  . SPINE SURGERY     3-stage spine fusion  . TONSILLECTOMY AND ADENOIDECTOMY    . URETHRAL DIVERTICULECTOMY         No family history on file.  Social History   Tobacco Use  . Smoking status: Former Games developer  . Smokeless tobacco: Never Used  . Tobacco comment: 1981  Vaping Use  .  Vaping Use: Never used  Substance Use Topics  . Alcohol use: Yes    Comment: occa  . Drug use: No    Home Medications Prior to Admission medications   Medication Sig Start Date End Date Taking? Authorizing Provider  acetaminophen (TYLENOL) 500 MG tablet Take 500 mg by mouth every 6 (six) hours as needed for mild pain, moderate pain or headache.     [provider]  cephALEXin (KEFLEX) 500 MG capsule Take 1 capsule (500 mg total) by mouth 2 (two) times daily. 03/05/20   Rolan Bucco, MD  gabapentin (NEURONTIN) 300 MG capsule Take 300 mg by mouth at bedtime as needed (pain).  04/17/19   [provider]  Multiple Vitamin (MULTIVITAMIN WITH MINERALS) TABS tablet Take 1 tablet by mouth daily.    [provider]  oxybutynin (DITROPAN) 5 MG tablet Take 5 mg by mouth daily.  06/25/19   [provider]  polyethylene glycol (MIRALAX / GLYCOLAX) 17 g packet Take 17 g by mouth daily as needed for mild constipation.    [provider]  polyethylene glycol powder (MIRALAX) 17 GM/SCOOP powder Start taking 1 capful 3 times a day. Slowly cut back as needed until you have normal bowel movements. 07/16/19   Nira Conn, MD  pravastatin (PRAVACHOL) 80  MG tablet Take 80 mg by mouth daily.  10/05/18   [provider]  traMADol (ULTRAM) 50 MG tablet Take 50 mg by mouth 2 (two) times daily as needed (for pain).     [provider]  XARELTO 10 MG TABS tablet Take 10 mg by mouth daily. 03/01/20   [provider]    Allergies    Codeine  Review of Systems   Review of Systems  Unable to perform ROS: Mental status change    Physical Exam Updated Vital Signs Pulse 88   Temp (!) 97 F (36.1 C) (Axillary)   Resp 14   SpO2 100%   Physical Exam Constitutional:      General: He is in acute distress.     Appearance: He is cachectic. He is ill-appearing.  HENT:     Head: Normocephalic and atraumatic.     Nose: Nose normal.      Mouth/Throat:     Mouth: Mucous membranes are dry.  Eyes:     Conjunctiva/sclera: Conjunctivae normal.     Comments: Appears to have right gaze preference  Cardiovascular:     Rate and Rhythm: Normal rate.     Pulses: Normal pulses.     Heart sounds: Normal heart sounds.  Pulmonary:     Breath sounds: Rhonchi present.     Comments: Poor respiratory effort, coarse breath sounds throughout Abdominal:     General: There is no distension.  Musculoskeletal:        General: No deformity. Normal range of motion.     Cervical back: Neck supple.     Right lower leg: No edema.     Left lower leg: No edema.  Skin:    General: Skin is warm.     Capillary Refill: Capillary refill takes less than 2 seconds.  Neurological:     GCS: GCS eye subscore is 2. GCS verbal subscore is 1. GCS motor subscore is 4.     ED Results / Procedures / Treatments   Labs (all labs ordered are listed, but only abnormal results are displayed) Labs Reviewed  CBC WITH DIFFERENTIAL/PLATELET - Abnormal; Notable for the following components:      Result Value   WBC 13.6 (*)    RBC 4.05 (*)    Hemoglobin 12.5 (*)    MCV 101.7 (*)    Platelets 429 (*)    Lymphs Abs 5.3 (*)    Monocytes Absolute 1.2 (*)    Eosinophils Absolute 0.6 (*)    Abs Immature Granulocytes 0.16 (*)    All other components within normal limits  BASIC METABOLIC PANEL - Abnormal; Notable for the following components:   Sodium 134 (*)    Glucose, Bld 147 (*)    Creatinine, Ser 0.43 (*)    All other components within normal limits  LACTIC ACID, PLASMA - Abnormal; Notable for the following components:   Lactic Acid, Venous 4.0 (*)    All other components within normal limits  PROTIME-INR - Abnormal; Notable for the following components:   Prothrombin Time 17.0 (*)    INR 1.4 (*)    All other components within normal limits  RESPIRATORY PANEL BY RT PCR (FLU A&B, COVID)  CULTURE, BLOOD (ROUTINE X 2)  CULTURE, BLOOD (ROUTINE X 2)  URINE  CULTURE  LACTIC ACID, PLASMA  APTT  URINALYSIS, ROUTINE W REFLEX MICROSCOPIC    EKG None  Radiology CT Head Wo Contrast  Result Date: April 25, 2020 CLINICAL DATA:  Mental status change,  unknown cause. EXAM: CT HEAD WITHOUT CONTRAST TECHNIQUE: Contiguous axial images were obtained from the base of the skull through the vertex without intravenous contrast. COMPARISON:  12/06/2018 CT head and prior. FINDINGS: Brain: No acute infarct or intracranial hemorrhage. No mass lesion. No midline shift, ventriculomegaly or extra-axial fluid collection. Chronic microvascular ischemic changes, similar to prior exam. Vascular: No hyperdense vessel or unexpected calcification. Bilateral skull base atherosclerotic calcifications. Skull: Negative for fracture or focal lesion. Sinuses/Orbits: Normal orbits. Clear paranasal sinuses. No mastoid effusion. Other: None. IMPRESSION: No acute intracranial process. Chronic microvascular ischemic changes, similar to prior exam. Electronically Signed   By: Stana Bunting M.D.   On: 02/27/2020 11:51   DG Chest Portable 1 View  Result Date: 03/16/2020 CLINICAL DATA:  Aspiration EXAM: PORTABLE CHEST 1 VIEW COMPARISON:  07/15/2019 FINDINGS: Mild patchy opacities in the right upper and lower lobes, suspicious for pneumonia. Left lung is essentially clear. No pleural effusion or pneumothorax. The heart is normal in size.  Thoracic aortic atherosclerosis. S shaped thoracolumbar scoliosis. Cervical spine fixation hardware, incompletely visualized. IMPRESSION: Mild patchy right lung opacities, suspicious for pneumonia in this patient with history of aspiration. Electronically Signed   By: Charline Bills M.D.   On: 03/04/2020 10:47    Procedures .Critical Care Performed by: Virgina Norfolk, DO Authorized by: Virgina Norfolk, DO   Critical care provider statement:    Critical care time (minutes):  45   Critical care was necessary to treat or prevent imminent or  life-threatening deterioration of the following conditions:  Sepsis and CNS failure or compromise   Critical care was time spent personally by me on the following activities:  Blood draw for specimens, development of treatment plan with patient or surrogate, discussions with primary provider, evaluation of patient's response to treatment, examination of patient, obtaining history from patient or surrogate, ordering and performing treatments and interventions, ordering and review of laboratory studies, ordering and review of radiographic studies, pulse oximetry, re-evaluation of patient's condition and review of old charts   I assumed direction of critical care for this patient from another provider in my specialty: no     (including critical care time)  Medications Ordered in ED Medications  sodium chloride 0.9 % bolus 500 mL (has no administration in time range)  levETIRAcetam (KEPPRA) IVPB 1000 mg/100 mL premix (has no administration in time range)  azithromycin (ZITHROMAX) 500 mg in sodium chloride 0.9 % 250 mL IVPB (500 mg Intravenous New Bag/Given 03/16/2020 1107)  cefTRIAXone (ROCEPHIN) 1 g in sodium chloride 0.9 % 100 mL IVPB (1 g Intravenous New Bag/Given 02/27/2020 1102)  LORazepam (ATIVAN) injection 0.5 mg (0.5 mg Intravenous Given 03/10/2020 1143)    ED Course  I have reviewed the triage vital signs and the nursing notes.  Pertinent labs & imaging results that were available during my care of the patient were reviewed by me and considered in my medical decision making (see chart for details).    MDM Rules/Calculators/A&P                          Damon Colon is an 79 year old male with history of paraplegia, post polio muscular atrophy who presents to the ED after aspiration event.  Patient with unremarkable vitals.  However shortly after arriving it appeared that he was having some difficulty with coughing and oxygen saturations dropped to the 80s.  Patient has a DNR.  Supposedly  with home health nurse patient is being fed  eggs and started to choke.  EMS was called.  Patient appeared to have poor respirations and was placed on a nonrebreather and brought here.  He has been unresponsive.  He does not follow commands.  He has some contractures in all 4 extremities.  He has some eye deviation to the right but overall hemodynamics are stable.  Suspect aspiration event and will start antibiotics and do sepsis work-up.  Not sure if he is having seizure-like episodes or if this is his baseline.  I have tried multiple times to call next of kin his phone numbers on file but no one will pick up.  Do not know patient's baseline.  Will give Ativan to see if there is any improvement.  Will get chest x-ray and head CT.  Patient appears to have aspiration pneumonia on chest x-ray.  White count 13.6.  Placed on nonrebreather and oxygen status.  Still with fairly decent respiratory effort.  Otherwise no significant anemia, electrolyte abnormality.  Creatinine unremarkable.  CT scan of the head is unremarkable.  Talked with neurology on the phone as concerned about possible seizures.  Will give patient Keppra load and get a spot EEG.  If patient is in status epilepticus can likely push for comfort care but believe aspiration event likely will likely force that issue as well.  I have still been unable to get in touch with his sister who is what appears to be the power of attorney.  Until then will maintain DNR and continue antibiotics, oxygen treatment.  We will continue to evaluate for seizures.  To be admitted to medicine at this time.  Social work has also gotten palliative care involved.  Phone number for family has been found to hospitalist update family.  Overall suspect the transition to comfort care and hospice placement.  This chart was dictated using voice recognition software.  Despite best efforts to proofread,  errors can occur which can change the documentation meaning.    Final  Clinical Impression(s) / ED Diagnoses Final diagnoses:  Altered mental status, unspecified altered mental status type  Aspiration pneumonia of right lung, unspecified aspiration pneumonia type, unspecified part of lung (HCC)  Sepsis, due to unspecified organism, unspecified whether acute organ dysfunction present [facility])    Rx / DC Orders ED Discharge Orders    None       Virgina Norfolk, DO 07-Apr-2020 1159    Virgina Norfolk, DO 07-Apr-2020 1226

## 2020-03-14 NOTE — ED Triage Notes (Signed)
Pt was at home with home health nurse. Pt started choking on eggs and stated he could not breath. Pt collapsed to the floor and stopped breathing. Home nurse initiated CPR. On EMS arrival, pt was not breathing. Pt is alert x orientated x0. Pt has a DNR form.    EMS vitals BP- 130/80 O2- 100 bagging R- 8 BS- 130

## 2020-03-15 ENCOUNTER — Inpatient Hospital Stay (HOSPITAL_COMMUNITY): Payer: Medicare Other

## 2020-03-15 DIAGNOSIS — Z7189 Other specified counseling: Secondary | ICD-10-CM | POA: Diagnosis not present

## 2020-03-15 DIAGNOSIS — J690 Pneumonitis due to inhalation of food and vomit: Secondary | ICD-10-CM | POA: Diagnosis not present

## 2020-03-15 DIAGNOSIS — G8250 Quadriplegia, unspecified: Secondary | ICD-10-CM | POA: Diagnosis not present

## 2020-03-15 DIAGNOSIS — J9601 Acute respiratory failure with hypoxia: Secondary | ICD-10-CM | POA: Diagnosis not present

## 2020-03-15 DIAGNOSIS — R4182 Altered mental status, unspecified: Secondary | ICD-10-CM | POA: Diagnosis not present

## 2020-03-15 DIAGNOSIS — Z515 Encounter for palliative care: Secondary | ICD-10-CM | POA: Diagnosis not present

## 2020-03-15 LAB — CBC
HCT: 36.8 % — ABNORMAL LOW (ref 39.0–52.0)
Hemoglobin: 11.5 g/dL — ABNORMAL LOW (ref 13.0–17.0)
MCH: 30.7 pg (ref 26.0–34.0)
MCHC: 31.3 g/dL (ref 30.0–36.0)
MCV: 98.4 fL (ref 80.0–100.0)
Platelets: 471 10*3/uL — ABNORMAL HIGH (ref 150–400)
RBC: 3.74 MIL/uL — ABNORMAL LOW (ref 4.22–5.81)
RDW: 13.5 % (ref 11.5–15.5)
WBC: 39.5 10*3/uL — ABNORMAL HIGH (ref 4.0–10.5)
nRBC: 0 % (ref 0.0–0.2)

## 2020-03-15 LAB — COMPREHENSIVE METABOLIC PANEL
ALT: 22 U/L (ref 0–44)
AST: 36 U/L (ref 15–41)
Albumin: 2.8 g/dL — ABNORMAL LOW (ref 3.5–5.0)
Alkaline Phosphatase: 54 U/L (ref 38–126)
Anion gap: 13 (ref 5–15)
BUN: 12 mg/dL (ref 8–23)
CO2: 25 mmol/L (ref 22–32)
Calcium: 9.2 mg/dL (ref 8.9–10.3)
Chloride: 102 mmol/L (ref 98–111)
Creatinine, Ser: 0.43 mg/dL — ABNORMAL LOW (ref 0.61–1.24)
GFR, Estimated: >60 mL/min (ref >60)
Glucose, Bld: 127 mg/dL — ABNORMAL HIGH (ref 70–99)
Potassium: 3.6 mmol/L (ref 3.5–5.1)
Sodium: 140 mmol/L (ref 135–145)
Total Bilirubin: 0.7 mg/dL (ref 0.3–1.2)
Total Protein: 6.9 g/dL (ref 6.5–8.1)

## 2020-03-15 LAB — TROPONIN I (HIGH SENSITIVITY)
Troponin I (High Sensitivity): 29 ng/L — ABNORMAL HIGH (ref <18)
Troponin I (High Sensitivity): 49 ng/L — ABNORMAL HIGH (ref <18)
Troponin I (High Sensitivity): 62 ng/L — ABNORMAL HIGH (ref <18)

## 2020-03-15 LAB — APTT
aPTT: 132 seconds — ABNORMAL HIGH (ref 24–36)
aPTT: 129 seconds — ABNORMAL HIGH (ref 24–36)
aPTT: 158 seconds — ABNORMAL HIGH (ref 24–36)

## 2020-03-15 LAB — BRAIN NATRIURETIC PEPTIDE: B Natriuretic Peptide: 267.2 pg/mL — ABNORMAL HIGH (ref 0.0–100.0)

## 2020-03-15 LAB — HEPARIN LEVEL (UNFRACTIONATED): Heparin Unfractionated: 1.44 IU/mL — ABNORMAL HIGH (ref 0.30–0.70)

## 2020-03-15 MED ORDER — MORPHINE SULFATE (PF) 2 MG/ML IV SOLN
1.0000 mg | INTRAVENOUS | Status: DC | PRN
  Administered 2020-03-16: 1 mg via INTRAVENOUS
  Filled 2020-03-15 (×2): qty 1

## 2020-03-15 MED ORDER — HEPARIN (PORCINE) 25000 UT/250ML-% IV SOLN
650.0000 [IU]/h | INTRAVENOUS | Status: DC
  Administered 2020-03-15: 600 [IU]/h via INTRAVENOUS
  Filled 2020-03-15: qty 250

## 2020-03-15 MED ORDER — LEVETIRACETAM IN NACL 500 MG/100ML IV SOLN
500.0000 mg | Freq: Two times a day (BID) | INTRAVENOUS | Status: DC
  Administered 2020-03-15 – 2020-03-17 (×3): 500 mg via INTRAVENOUS
  Filled 2020-03-15 (×6): qty 100

## 2020-03-15 NOTE — ED Notes (Signed)
Ordered a bed--Damon Colon

## 2020-03-15 NOTE — ED Notes (Addendum)
Date and time results received: 03/15/20 0253  Test: Trop Critical Value: 76   Name of Provider Notified: Rathore   Orders Received? Or Actions Taken?:  Will be repeated in a few hours

## 2020-03-15 NOTE — Progress Notes (Signed)
vLTM EEG complete. No skin breakdown 

## 2020-03-15 NOTE — Progress Notes (Signed)
ANTICOAGULATION CONSULT NOTE - Initial Consult  Pharmacy Consult for heparin gtt Indication: history of DVT  Allergies  Allergen Reactions  . Codeine Other (See Comments)    Reaction:  Somnolence     Patient Measurements: Height: 5\' 8"  (172.7 cm) Weight: 72.6 kg (160 lb 0.9 oz) IBW/kg (Calculated) : 68.4 Heparin Dosing Weight: 72.6kg  Vital Signs: BP: 166/63 (10/18 1815) Pulse Rate: 108 (10/18 1815)  Labs: Recent Labs     0000 03-23-20 1015 23-Mar-2020 1042 2020-03-23 1829 2020/03/23 2242 2020/03/23 2258 03/15/20 0137 03/15/20 0358 03/15/20 0608 03/15/20 0909 03/15/20 1813  HGB   < > 12.5*  --   --  12.6*  --   --   --   --  11.5*  --   HCT  --  41.2  --   --  37.0*  --   --   --   --  36.8*  --   PLT  --  429*  --   --   --   --   --   --   --  471*  --   APTT  --   --   --    < >  --   --   --  132* 129*  --  158*  LABPROT  --   --  17.0*  --   --   --   --   --   --   --   --   INR  --   --  1.4*  --   --   --   --   --   --   --   --   HEPARINUNFRC  --   --   --   --   --   --   --  1.44*  --   --   --   CREATININE  --  0.43*  --   --   --   --   --  0.43*  --   --   --   TROPONINIHS  --   --   --   --   --  29* 49* 62*  --   --   --    < > = values in this interval not displayed.    Estimated Creatinine Clearance: 71.3 mL/min (A) (by C-G formula based on SCr of 0.43 mg/dL (L)).   Medical History: Past Medical History:  Diagnosis Date  . Arthritis   . Bruises easily   . Fever    102  . Hypertension   . Paraplegia (HCC)   . Polio   . 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 Wears glasses     Assessment: 79 year old male presenting with AMS, on Xarelto PTA with last dose taken 10/16 ~1800.  Will follow aPTT until Anti-Xa level correlates.    APTT 158 (supratherapeutic)   Goal of Therapy:  Heparin level 0.3-0.7 units/ml  APTT 66-102 seconds Monitor platelets by anticoagulation protocol: Yes   Plan:  Hold heparin gtt x1 hour  Decrease rate  to 600 units/hr F/u 8hr aPTT F/u ability to transition back to PO anticoagulation  11/16  PGY1 pharmacy resident 03/15/2020,7:40 PM

## 2020-03-15 NOTE — ED Notes (Signed)
PAGED DR RATHORE TO RN JENNIFER--Damon Colon  

## 2020-03-15 NOTE — Progress Notes (Signed)
Pt holding in the ED for a progressive care bed and seen on f/u rounds. No acute issues at this time. Care discussed with RN. Please contact me if there are any questions or concerns.  

## 2020-03-15 NOTE — Progress Notes (Addendum)
PROGRESS NOTE    Damon Colon  ZHG:[id number] DOB: 04-Jun-1940 DOA: 03/09/2020 PCP: Catha Gosselin, MD  Brief Narrative: This is a chronically ill debilitated 79 year old male with history of polio, post poliomyelitis, severe muscular atrophy, quadriplegia, urinary retention chronic Foley, contracted extremities, DVT was brought to the emergency room 10/17 with decreased responsiveness, choking and shortness of breath. -In the emergency room he was noted to be febrile to 101, hypoxic requiring a nonrebreather mask, WBC of 13.6, lactic acid of 4.0, COVID-19 team PCR is pending, chest x-ray concerning for right lower lobe pneumonia possibly aspiration, EDP also asked neurology to see him due to right eye deviation and decreased responsiveness yesterday he was given a dose of Keppra Ativan, IVF and antibiotics   Assessment & Plan:    Sepsis, poa Acute hypoxic respiratory failure Aspiration pneumonia -Patient is extremely debilitated quadriplegia, severe muscular atrophy and likely dysphagia -Agree with IV Unasyn, continue same -N.p.o., SLP evaluation -Currently on 16 L high flow O2 -WBC is 39K today, FU blood CX -Palliative care also consulted and following, suspect he will be ongoing risk for further aspiration and recurrent pneumonia -followed by palliative care at home, according to sister he is cared for 24/7 by caregivers he has been declining, losing weight and has been bedbound for the past several months, hospice at home being considered  Toxic encephalopathy -This is likely secondary to sepsis, aspiration pneumonia -EEG  w/ temporal lobe spike, started on keppra per Neuro  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 -Supportive care, prognosis is poor, followed by palliative care  History of neurogenic bladder -Has a chronic Foley  History of DVT -On Xarelto at baseline, currently on heparin  Severe protein calorie malnutrition  DVT  prophylaxis: IV heparin CODE STATUS: DNR  Family communication: No family at bedside, will update sister later today Dispo:  Remains inpatient appropriate because:Inpatient level of care appropriate due to severity of illness   Dispo: The patient is from: Home              Anticipated d/c is to: Home              Anticipated d/c date is: > 3 days              Patient currently is not medically stable to d/c.        Consultants:  SLP Palliative   Procedures:   Antimicrobials:    Subjective: -Oxygen bumped up to 60 L high flow and a nonrebreather mask this morning  Objective: Vitals:   03/15/20 0600 03/15/20 0615 03/15/20 0630 03/15/20 0645  BP: (!) 141/58 (!) 156/59 (!) 142/61 (!) 156/74  Pulse: (!) 105 (!) 109 (!) 107 (!) 120  Resp: 20 (!) 23 (!) 26 (!) 27  Temp:      TempSrc:      SpO2: 93% 93% 94% 93%  Weight:      Height:        Intake/Output Summary (Last 24 hours) at 03/15/2020 1026 Last data filed at 03/19/2020 2300 Gross per 24 hour  Intake 447.36 ml  Output 600 ml  Net -152.64 ml   Filed Weights   03/06/2020 2155  Weight: 72.6 kg    Examination:  General exam: Chronically ill extremely cachectic male laying in bed, awake and alert, mumbles a few incomprehensible words, CVS: S1-S2, regular rhythm, tachycardic Lungs with scattered rhonchi bilaterally and conducted upper airway sounds Abdomen: Soft, not nontender, depends on , Foley catheter with concentrated urine  with dark sediments noted  Extremities contractures, extremely atrophied muscles  Neuro, awake and alert, unable to assess orientation, quadriplegic  Data Reviewed:   CBC: Recent Labs  Lab 03/15/2020 1015 03/09/2020 2242 03/15/20 0909  WBC 13.6*  --  39.5*  NEUTROABS 6.3  --   --   HGB 12.5* 12.6* 11.5*  HCT 41.2 37.0* 36.8*  MCV 101.7*  --  98.4  PLT 429*  --  471*   Basic Metabolic Panel: Recent Labs  Lab 03/03/2020 1015 03/13/2020 2242 03/15/20 0358  NA 134* 135 140  K  4.9 3.7 3.6  CL 99  --  102  CO2 22  --  25  GLUCOSE 147*  --  127*  BUN 15  --  12  CREATININE 0.43*  --  0.43*  CALCIUM 9.8  --  9.2  MG 2.2  --   --    GFR: Estimated Creatinine Clearance: 71.3 mL/min (A) (by C-G formula based on SCr of 0.43 mg/dL (L)). Liver Function Tests: Recent Labs  Lab 03/15/20 0358  AST 36  ALT 22  ALKPHOS 54  BILITOT 0.7  PROT 6.9  ALBUMIN 2.8*   No results for input(s): LIPASE, AMYLASE in the last 168 hours. Recent Labs  Lab 03/07/2020 1829  AMMONIA 21   Coagulation Profile: Recent Labs  Lab 03/19/2020 1042  INR 1.4*   Cardiac Enzymes: No results for input(s): CKTOTAL, CKMB, CKMBINDEX, TROPONINI in the last 168 hours. BNP (last 3 results) No results for input(s): PROBNP in the last 8760 hours. HbA1C: No results for input(s): HGBA1C in the last 72 hours. CBG: No results for input(s): GLUCAP in the last 168 hours. Lipid Profile: No results for input(s): CHOL, HDL, LDLCALC, TRIG, CHOLHDL, LDLDIRECT in the last 72 hours. Thyroid Function Tests: Recent Labs    03/27/2020 1829  TSH 0.700   Anemia Panel: Recent Labs    03/05/2020 1829  VITAMINB12 995*  FOLATE 47.2   Urine analysis:    Component Value Date/Time   COLORURINE RED (A) 03/23/2020 1830   APPEARANCEUR TURBID (A) 03/20/2020 1830   LABSPEC  03/05/2020 1830    TEST NOT REPORTED DUE TO COLOR INTERFERENCE OF URINE PIGMENT   PHURINE  02/27/2020 1830    TEST NOT REPORTED DUE TO COLOR INTERFERENCE OF URINE PIGMENT   GLUCOSEU (A) 03/13/2020 1830    TEST NOT REPORTED DUE TO COLOR INTERFERENCE OF URINE PIGMENT   HGBUR (A) 03/20/2020 1830    TEST NOT REPORTED DUE TO COLOR INTERFERENCE OF URINE PIGMENT   BILIRUBINUR (A) 03/02/2020 1830    TEST NOT REPORTED DUE TO COLOR INTERFERENCE OF URINE PIGMENT   KETONESUR (A) 03/26/2020 1830    TEST NOT REPORTED DUE TO COLOR INTERFERENCE OF URINE PIGMENT   PROTEINUR (A) 03/06/2020 1830    TEST NOT REPORTED DUE TO COLOR INTERFERENCE OF URINE  PIGMENT   NITRITE (A) 03/16/2020 1830    TEST NOT REPORTED DUE TO COLOR INTERFERENCE OF URINE PIGMENT   LEUKOCYTESUR (A) 03/27/2020 1830    NO FORMED ELEMENTS SEEN ON URINE MICROSCOPIC EXAMINATION   Sepsis Labs: @LABRCNTIP (procalcitonin:4,lacticidven:4)  ) Recent Results (from the past 240 hour(s))  Urine culture     Status: Abnormal   Collection Time: 03/05/20  5:54 PM   Specimen: Urine, Random  Result Value Ref Range Status   Specimen Description   Final    URINE, RANDOM Performed at [facility], [address], [place], [place]    Special Requests  Final    NONE Performed at [facility], [address], [place], [place] [address]    Culture (A)  Final    >=100,000 COLONIES/mL ESCHERICHIA COLI >=100,000 COLONIES/mL CITROBACTER KOSERI    Report Status 03/08/2020 FINAL  Final   Organism ID, Bacteria ESCHERICHIA COLI (A)  Final   Organism ID, Bacteria CITROBACTER KOSERI (A)  Final      Susceptibility   Citrobacter koseri - MIC*    CEFAZOLIN <=4 SENSITIVE Sensitive     CEFTRIAXONE <=0.25 SENSITIVE Sensitive     CIPROFLOXACIN <=0.25 SENSITIVE Sensitive     GENTAMICIN <=1 SENSITIVE Sensitive     IMIPENEM <=0.25 SENSITIVE Sensitive     NITROFURANTOIN 64 INTERMEDIATE Intermediate     TRIMETH/SULFA <=20 SENSITIVE Sensitive     PIP/TAZO <=4 SENSITIVE Sensitive     * >=100,000 COLONIES/mL CITROBACTER KOSERI   Escherichia coli - MIC*    AMPICILLIN <=2 SENSITIVE Sensitive     CEFAZOLIN <=4 SENSITIVE Sensitive     CEFTRIAXONE <=0.25 SENSITIVE Sensitive     CIPROFLOXACIN <=0.25 SENSITIVE Sensitive     GENTAMICIN <=1 SENSITIVE Sensitive     IMIPENEM <=0.25 SENSITIVE Sensitive     NITROFURANTOIN <=16 SENSITIVE Sensitive     TRIMETH/SULFA <=20 SENSITIVE Sensitive     AMPICILLIN/SULBACTAM <=2 SENSITIVE Sensitive     PIP/TAZO <=4 SENSITIVE Sensitive     * >=100,000 COLONIES/mL ESCHERICHIA COLI  Blood culture (routine x 2)      Status: None (Preliminary result)   Collection Time: 03/16/20 10:42 AM   Specimen: BLOOD  Result Value Ref Range Status   Specimen Description BLOOD RIGHT ANTECUBITAL  Final   Special Requests   Final    BOTTLES DRAWN AEROBIC AND ANAEROBIC Blood Culture results may not be optimal due to an excessive volume of blood received in culture bottles   Culture   Final    NO GROWTH < 24 HOURS Performed at [facility], [address], [place], [place] [address]    Report Status PENDING  Incomplete  Blood culture (routine x 2)     Status: None (Preliminary result)   Collection Time: 03-16-20 10:55 AM   Specimen: BLOOD RIGHT WRIST  Result Value Ref Range Status   Specimen Description BLOOD RIGHT WRIST  Final   Special Requests   Final    BOTTLES DRAWN AEROBIC AND ANAEROBIC Blood Culture results may not be optimal due to an inadequate volume of blood received in culture bottles   Culture   Final    NO GROWTH < 24 HOURS Performed at [facility], [address], [place], [place] [address]    Report Status PENDING  Incomplete  Respiratory Panel by RT PCR (Flu A&B, Covid) - Nasopharyngeal Swab     Status: None   Collection Time: 03/16/2020 11:25 AM   Specimen: Nasopharyngeal Swab  Result Value Ref Range Status   SARS Coronavirus 2 by RT PCR NEGATIVE NEGATIVE Final    Comment: (NOTE) SARS-CoV-2 target nucleic acids are NOT DETECTED.  The SARS-CoV-2 RNA is generally detectable in upper respiratoy specimens during the acute phase of infection. The lowest concentration of SARS-CoV-2 viral copies this assay can detect is 131 copies/mL. A negative result does not preclude SARS-Cov-2 infection and should not be used as the sole basis for treatment or other patient management decisions. A negative result may occur with  improper specimen collection/handling, submission of specimen other than nasopharyngeal swab, presence of viral mutation(s) within the areas  targeted by this assay, and  inadequate number of viral copies (<131 copies/mL). A negative result must be combined with clinical observations, patient history, and epidemiological information. The expected result is Negative.  Fact Sheet for Patients:  https://www.moore.com/https://www.fda.gov/media/142436/download  Fact Sheet for Healthcare Providers:  https://www.young.biz/https://www.fda.gov/media/142435/download  This test is no t yet approved or cleared by the [place] FDA and  has been authorized for detection and/or diagnosis of SARS-CoV-2 by FDA under an Emergency Use Authorization (EUA). This EUA will remain  in effect (meaning this test can be used) for the duration of the COVID-19 declaration under Section 564(b)(1) of the Act, 21 U.S.C. section 360bbb-3(b)(1), unless the authorization is terminated or revoked sooner.     Influenza A by PCR NEGATIVE NEGATIVE Final   Influenza B by PCR NEGATIVE NEGATIVE Final    Comment: (NOTE) The Xpert Xpress SARS-CoV-2/FLU/RSV assay is intended as an aid in  the diagnosis of influenza from Nasopharyngeal swab specimens and  should not be used as a sole basis for treatment. Nasal washings and  aspirates are unacceptable for Xpert Xpress SARS-CoV-2/FLU/RSV  testing.  Fact Sheet for Patients: https://www.moore.com/https://www.fda.gov/media/142436/download  Fact Sheet for Healthcare Providers: https://www.young.biz/https://www.fda.gov/media/142435/download  This test is not yet approved or cleared by the [place] FDA and  has been authorized for detection and/or diagnosis of SARS-CoV-2 by  FDA under an Emergency Use Authorization (EUA). This EUA will remain  in effect (meaning this test can be used) for the duration of the  Covid-19 declaration under Section 564(b)(1) of the Act, 21  U.S.C. section 360bbb-3(b)(1), unless the authorization is  terminated or revoked. Performed at [facility], [address] [place], [place] [address]     Radiology Studies: CT Head Wo Contrast  Result Date: 03/22/2020 CLINICAL DATA:  Mental  status change, unknown cause. EXAM: CT HEAD WITHOUT CONTRAST TECHNIQUE: Contiguous axial images were obtained from the base of the skull through the vertex without intravenous contrast. COMPARISON:  12/06/2018 CT head and prior. FINDINGS: Brain: No acute infarct or intracranial hemorrhage. No mass lesion. No midline shift, ventriculomegaly or extra-axial fluid collection. Chronic microvascular ischemic changes, similar to prior exam. Vascular: No hyperdense vessel or unexpected calcification. Bilateral skull base atherosclerotic calcifications. Skull: Negative for fracture or focal lesion. Sinuses/Orbits: Normal orbits. Clear paranasal sinuses. No mastoid effusion. Other: None. IMPRESSION: No acute intracranial process. Chronic microvascular ischemic changes, similar to prior exam. Electronically Signed   By: Stana Buntinghikanele  Emekauwa M.D.   On: 03/13/2020 11:51   DG CHEST PORT 1 VIEW  Result Date: 03/06/2020 CLINICAL DATA:  Hypoxia EXAM: PORTABLE CHEST 1 VIEW COMPARISON:  03/09/2020 FINDINGS: Heart is normal size. There is hyperinflation of the lungs compatible with COPD. Aortic atherosclerosis. Patchy bilateral perihilar opacities. No effusions. No acute bony abnormality. IMPRESSION: Hyperinflation/COPD. Patchy bilateral perihilar opacities concerning for pneumonia. Electronically Signed   By: Charlett NoseKevin  Dover M.D.   On: 03/09/2020 22:41   DG Chest Portable 1 View  Result Date: 03/18/2020 CLINICAL DATA:  Aspiration EXAM: PORTABLE CHEST 1 VIEW COMPARISON:  07/15/2019 FINDINGS: Mild patchy opacities in the right upper and lower lobes, suspicious for pneumonia. Left lung is essentially clear. No pleural effusion or pneumothorax. The heart is normal in size.  Thoracic aortic atherosclerosis. S shaped thoracolumbar scoliosis. Cervical spine fixation hardware, incompletely visualized. IMPRESSION: Mild patchy right lung opacities, suspicious for pneumonia in this patient with history of aspiration. Electronically Signed    By: Charline BillsSriyesh  Krishnan M.D.   On: 03/22/2020 10:47   EEG adult  Result Date: 04/10/2020 Charlsie Quest, MD     03/15/2020  9:10 AM Patient Name: Damon Colon MRN: [id number] Epilepsy Attending: Charlsie Quest Referring Physician/Provider: Dr Erick Blinks Date: 2020/04/10 Duration: 24.36 mins Patient history: 79yo M with ams and right gaze deviation. EEf to evaluate for seizure. Level of alertness: comatose AEDs during EEG study: None Technical aspects: This EEG study was done with scalp electrodes positioned according to the 10-20 International system of electrode placement. Electrical activity was acquired at a sampling rate of  and reviewed with a high frequency filter of  and a low frequency filter of . EEG data were recorded continuously and digitally stored. Description: No posterior dominant rhythm was seen. EEG showed continuous generalized 3 to 6 Hz theta-delta slowing. Hyperventilation and photic stimulation were not performed.   ABNORMALITY -Continuous slow, generalized IMPRESSION: This study is suggestive of severe diffuse encephalopathy, nonspecific etiology. No seizures or epileptiform discharges were seen throughout the recording. Priyanka Annabelle Harman   Overnight EEG with video  Result Date: 03/15/2020 Charlsie Quest, MD     03/15/2020  9:25 AM Patient Name: Damon Colon MRN: [id number] Epilepsy Attending: Charlsie Quest Referring Physician/Provider: Dr Erick Blinks Duration: Apr 10, 2020 1448 to 03/15/2020 0915  Patient history: 79yo M with ams and right gaze deviation. EEf to evaluate for seizure.  Level of alertness: awake, asleep  AEDs during EEG study: None  Technical aspects: This EEG study was done with scalp electrodes positioned according to the 10-20 International system of electrode placement. Electrical activity was acquired at a sampling rate of  and reviewed with a high frequency filter of  and a low frequency filter of . EEG data  were recorded continuously and digitally stored.  Description: No posterior dominant rhythm was seen.Sleep was characterized by vertex waves,sleep spindles (12-14hz ), maximal frontocentral region.  EEG showed continuous generalized 3 to 6 Hz theta-delta slowing. Left temporal spikes were also noted. Hyperventilation and photic stimulation were not performed.    ABNORMALITY - Spike, left temporal region -Continuous slow, generalized  IMPRESSION: This study showed evidence of epileptogenicity arising from left temporal region as well as moderate to severe diffuse encephalopathy, nonspecific etiology. No seizures were seen throughout the recording.  Priyanka Annabelle Harman        Scheduled Meds: Continuous Infusions: . sodium chloride 100 mL/hr at 04-10-2020 1856  . ampicillin-sulbactam (UNASYN) IV Stopped (03/15/20 0418)  . heparin 1,000 Units/hr (2020-04-10 1852)     LOS: 1 day    Time spent:  Zannie Cove, MD [facility]  03/15/2020, 10:26 AM

## 2020-03-15 NOTE — Progress Notes (Addendum)
Daily Progress Note   Patient Name: Damon Colon       Date: 03/15/2020 DOB: Dec 25, 1940  Age: 79 y.o. MRN#: [id number] Attending Physician: Zannie Cove, MD Primary Care Physician: Catha Gosselin, MD Admit Date: 03/23/2020  Reason for Follow-up: continued GOC discussion  Subjective: Patient is still in the ED. Currently on 15L NRB and 6L nasal cannula. Spo2 98%. Breathing appears mildly labored. I asked the patient several yes and no questions. He nods yes when I asked if the mask was uncomfortable. He nods yes when I asked if he was in pain. He also nods yes when I asked if he would prefer to go home and be kept comfortable. I states that would mean stopping the antibiotics and other medications he was receiving now and decreasing the oxygen. He again nods yes.   I called sister/Damon Colon to discuss diagnosis, prognosis, GOC, EOL wishes, disposition, and options. Patient's long-time caregiver Damon Colon was also on the phone  We discussed a brief social history of the patient. Damon Colon is his only sibling. In 2008, he suffered a fall and lived in the [facility] care home. He has been non-ambulatory since then. About 6 years, he transitioned to live back at his house with round the clock caregivers. Damon Colon and Damon Colon report his functional and nutritional status has significantly declined throughout this year.    We discussed his current illness and what it means in the larger context of his ongoing co-morbidities.  Natural disease trajectory of progressive neuromuscular disease was discussed. Damon Colon and Damon Colon verbalize understanding and acceptance that his condition is progressive and ultimately terminal.   Discussed that aspiration pneumonia will likely become a recurrent issue. Discussed that artificial feeding  is not typically recommended for patients in his condition and does not eliminate the risk of aspiration. Damon Colon states he would not want artifical feeding.   I introduced the concept of a comfort path to [name] and encouraged her to think about at what point she would want to stop medical interventions and focus on comfort and dignity rather than prolonging life. Introduced hospice philosophy and information on home vs residential hospice services - answered all questions. Damon Colon is very receptive to comfort care and hospice.   Questions and concerns were addressed.  The family was encouraged to call with questions or concerns.  Length of Stay: 1  Current Medications: Scheduled Meds:    Continuous Infusions: . sodium chloride 75 mL/hr at 03/15/20 1106  . ampicillin-sulbactam (UNASYN) IV Stopped (03/15/20 1102)  . heparin 850 Units/hr (03/15/20 1032)  . levETIRAcetam      PRN Meds: acetaminophen **OR** acetaminophen, ondansetron **OR** ondansetron (ZOFRAN) IV  Physical Exam Vitals reviewed.  Constitutional:      Appearance: He is cachectic. He is ill-appearing.  Cardiovascular:     Rate and Rhythm: Normal rate.  Pulmonary:     Effort: Tachypnea present.     Comments: Breathing appears mildly labored Neurological:     Mental Status: He is lethargic.             Vital Signs: BP (!) 141/67   Pulse (!) 110   Temp 99.7 F (37.6 C) (Rectal)   Resp 19   Ht 5\' 8"  (1.727 m)   Wt 72.6 kg   SpO2 98%   BMI 24.34 kg/m  SpO2: SpO2: 98 % O2 Device: O2 Device: Nasal Cannula, NRB O2 Flow Rate: O2 Flow Rate (L/min): 15 L/min  Intake/output summary:   Intake/Output Summary (Last 24 hours) at 03/15/2020 1416 Last data filed at 2020-03-31 2300 Gross per 24 hour  Intake --  Output 600 ml  Net -600 ml   LBM:   Baseline Weight: Weight: 72.6 kg Most recent weight: Weight: 72.6 kg       Palliative Assessment/Data: PPS 10%    Palliative Care Assessment & Plan   HPI/Patient  Profile: 79 y.o. male  with past medical history of polio, postpoliomyelitis muscular atrophy, quadriplegia, bladder spasms, contractures to all extremities, DVT, hypertension,cervical stenosis presented to the ED on 03-31-20 after choking on eggs at breakfast and becoming unresponsive at home - CPR was initiated by home health aid and EMS was called. ED Course:Upon arrival to ED: Patient temperature was noted 36.1, hypoxemic requiring nonrebreather, leukocytosis of 13.6-chronic, MCV: 101.7, platelet: 427, lactic acid of 4.0, CT head came back negative, UA, UC, BC: Pending. COVID-19 negative. Chest x-ray concerning for right lung pneumonia concerning for aspiration. EDP consulted neurology as patient had eye deviation to the right-concerning for seizures like episode. Patient was given IV fluid of 500 cc, Ativan, Keppra of 1000 mg, Rocephin and azithromycin in ED. [facility] consulted for admission for altered mental status/aspiration pneumonia.  Assessment: - aspiration pneumonia - acute hypoxic respiratory failure with hypoxia  - toxic encephalopathy - post poliomyelitis with severe muscular atrophy - quadriplegia  Recommendations/Plan: - continue supportive care for now - titrate oxygen down as tolerated (start with NRB), goal spo2 > 92% - morphine 1 mg every 4 hours prn for pain or dyspnea - sister is open to transition to home hospice care  - For home hospice, patient must be on 15L or less  Goals of Care and Additional Recommendations: Limitations on Scope of Treatment: No Artificial Feeding  Code Status: DNR/DNI  Prognosis:  < 6 months  Discharge Planning: To Be Determined  Care plan was discussed with Dr. 03/16/20  Thank you for allowing the Palliative Medicine Team to assist in the care of this patient.   Total Time 67 minutes Prolonged Time Billed  yes       Greater than 50%  of this time was spent counseling and coordinating care related to the above  assessment and plan.  Jomarie Longs, NP  Please contact Palliative Medicine Team phone at [phone] for questions and concerns.

## 2020-03-15 NOTE — ED Notes (Signed)
Spoke to internal med provider and was told to hold off on the ct chest angio dt patient condition. If HR increases above 150 notify internal medicine.

## 2020-03-15 NOTE — Progress Notes (Signed)
ANTICOAGULATION CONSULT NOTE - Initial Consult  Pharmacy Consult for heparin Indication: Hx DVTs  Allergies  Allergen Reactions  . Codeine Other (See Comments)    Reaction:  Somnolence     Patient Measurements: Height: 5\' 8"  (172.7 cm) Weight: 72.6 kg (160 lb 0.9 oz) IBW/kg (Calculated) : 68.4 Heparin Dosing Weight: TBW  Vital Signs: BP: 156/74 (10/18 0645) Pulse Rate: 120 (10/18 0645)  Labs: Recent Labs    2020/04/12 1015 04/12/20 1042 12-Apr-2020 1829 12-Apr-2020 2242 12-Apr-2020 2258 03/15/20 0137 03/15/20 0358 03/15/20 0608  HGB 12.5*  --   --  12.6*  --   --   --   --   HCT 41.2  --   --  37.0*  --   --   --   --   PLT 429*  --   --   --   --   --   --   --   APTT  --   --  33  --   --   --  132* 129*  LABPROT  --  17.0*  --   --   --   --   --   --   INR  --  1.4*  --   --   --   --   --   --   HEPARINUNFRC  --   --   --   --   --   --  1.44*  --   CREATININE 0.43*  --   --   --   --   --  0.43*  --   TROPONINIHS  --   --   --   --  29* 49* 62*  --     Estimated Creatinine Clearance: 71.3 mL/min (A) (by C-G formula based on SCr of 0.43 mg/dL (L)).   Medical History: Past Medical History:  Diagnosis Date  . Arthritis   . Bruises easily   . Fever    102  . Hypertension   . Paraplegia (HCC)   . Polio   . 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 Wears glasses    Assessment: 86 YOM presenting with AMS, on Xarelto PTA with last dose taken 10/16 ~1800.  Will follow aPTT until Anti-Xa level correlates.    APTT 129 and HL 1.44  Goal of Therapy:  Heparin level 0.3-0.7 units/ml aPTT 66-102 seconds Monitor platelets by anticoagulation protocol: Yes   Plan:   Decrease Heparin gtt to 850 units/hr F/u 8 hour aPTT F/u ability to transition back to PO  11/16, PharmD, [facility] Clinical Pharmacist Please see AMION for all Pharmacists' Contact Phone Numbers 03/15/2020, 8:03 AM

## 2020-03-15 NOTE — Progress Notes (Signed)
Current Palliative:  [facility] ([facility] Community Based Palliative Care       This patient is enrolled in our palliative care services in the community.  [facility] will continue to follow for any discharge planning needs and to coordinate continuation of palliative care.   If you have questions or need assistance, please call [phone] or contact the hospital Liaison listed on AMION.     Thank you for the opportunity to participate in this patient's care.     Chrislyn King, BSN, RN [facility] Hospital Liaison   [phone]  

## 2020-03-15 NOTE — Progress Notes (Signed)
NEUROLOGY CONSULTATION PROGRESS NOTE   Date of service: March 15, 2020 Patient Name: Damon Colon MRN:  [id number] DOB:  1940/06/16  Brief HPI   Damon Colon is a 79 y.o. 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 Was noted to have persistent R gaze deviation on initial exam, significantly improved after Keppra load was given   Interval Hx   cEEG desmontrated some L temporal spikes, he is now awake, alert, tracking, following commands. I started Keppra 500mg  BID.  Vitals   Vitals:   03/15/20 1015 03/15/20 1030 03/15/20 1045 03/15/20 1100  BP: (!) 130/52 (!) 150/53 (!) 153/61 (!) 141/67  Pulse: (!) 105 (!) 108 (!) 115 (!) 110  Resp: (!) 23 (!) 21 (!) 21 19  Temp:      TempSrc:      SpO2: 92% 95% 95% 98%  Weight:      Height:         Body mass index is 24.34 kg/m.  Physical Exam   General: Laying comfortably in bed; in no acute distress. HENT: Normal oropharynx and mucosa. Normal external appearance of ears and nose. Neck: Supple, no pain or tenderness CV: No JVD. No peripheral edema. Pulmonary: Symmetric Chest rise. Tachypneic. Abdomen: Soft to touch, non-tender. Ext: No cyanosis, edema, or deformity Skin: No rash. Normal palpation of skin.  Musculoskeletal: Wasted extremities with contractures in BL hands and feet. No clubbing.  Neurologic Examination  Mental status/Cognition: Alert, awake, attempts to speak but very soft voice and difficult to understand on a nonrebreather. Tracks my face across the room. Speech/language: soft, whispery and dysphagia. Cranial nerves:   CN II Pupils equal and reactive to light, no VF deficits   CN III,IV,VI EOM intact, no  gaze preference or deviation, no nystagmus, dysconjugate gaze when looking upwards.   CN V normal sensation in V1, V2, and V3 segments bilaterally   CN VII Facial diplegia.   CN VIII normal hearing to speech   CN IX & X Poor uvula and soft palate elevation   CN XI    CN XII midline tongue protrusion   Motor:  Muscle bulk: poor and appears cachectic, tone hypotonia LUE: Able to give a thumbs up to command RUE: Able to give a thumbs up to command LLE and RLE: Wiggles toes to command     Sensation:  Light touch Intact in all extremities to light touch   Pin prick    Temperature    Vibration   Proprioception    Reflexes: 1+ BL in all extremities and symmetric.  Labs   Basic Metabolic Panel:  Lab Results  Component Value Date   NA 140 03/15/2020   K 3.6 03/15/2020   CO2 25 03/15/2020   GLUCOSE 127 (H) 03/15/2020   BUN 12 03/15/2020   CREATININE 0.43 (L) 03/15/2020   CALCIUM 9.2 03/15/2020   GFRNONAA >60 03/15/2020   GFRAA NOT CALCULATED 07/24/2019   HbA1c: No results found for: HGBA1C LDL: No results found for: [facility] Urine Drug Screen:     Component Value Date/Time   LABOPIA NONE DETECTED 02/29/2020 1830   COCAINSCRNUR NONE DETECTED 03/19/2020 1830   LABBENZ NONE DETECTED 03/07/2020 1830   AMPHETMU NONE DETECTED 03/28/2020  1830   THCU NONE DETECTED 03/07/2020 1830   LABBARB NONE DETECTED 02/29/2020 1830    Alcohol Level No results found for: ETH No results found for: PHENYTOIN, ZONISAMIDE, LAMOTRIGINE, LEVETIRACETA No results found for: PHENYTOIN, PHENOBARB, VALPROATE, CBMZ  Imaging and Diagnostic studies  Results for orders placed during the hospital encounter of 03/06/2020  CT Head Wo Contrast  Narrative CLINICAL DATA:  Mental status change, unknown cause.  EXAM: CT HEAD WITHOUT CONTRAST  TECHNIQUE: Contiguous axial images were obtained from the base of the skull through the vertex without intravenous contrast.  COMPARISON:  12/06/2018 CT head and  prior.  FINDINGS: Brain: No acute infarct or intracranial hemorrhage. No mass lesion. No midline shift, ventriculomegaly or extra-axial fluid collection. Chronic microvascular ischemic changes, similar to prior exam.  Vascular: No hyperdense vessel or unexpected calcification. Bilateral skull base atherosclerotic calcifications.  Skull: Negative for fracture or focal lesion.  Sinuses/Orbits: Normal orbits. Clear paranasal sinuses. No mastoid effusion.  Other: None.  IMPRESSION: No acute intracranial process.  Chronic microvascular ischemic changes, similar to prior exam.   Electronically Signed By: Stana Bunting M.D. On: 03/22/2020 11:51   Impression   Damon Colon is a 79 y.o. 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 Noted to have R gaze deviation in the ED and given small dose of Ativan 1mg  and loaded with Keppra. rEEG initially was negative. cEEG demonstrated L temporal spikes concerning for potential epileptogenicity.  I think the event of persistent R gaze deviation and encephalopathy noted yesterday was probably a seizure. As for whether it was provoked or not, I am not sure if the seizure was provoked by choking episode, which would be somewhat odd. It seems like aspiration happened in the setting of that choking and probably happened later. With the noted L temporal spikes, we should get an MRI to evaluate for any structural abnormality. If the patient is going to hospice, then can hold off on MRI and just continue Keppra for now.  Recommendations  - I ordered Keppra 500mg  IV BID, can transition to Keppra 500mg  BID PO when able - Recommend MRI Brain with and  without contrast when stable from a respiratory standpoint and if okay with the primary team. This was not ordered but primary team was notified. - I discontinue cEEG, recommend clinical monitoring for seizures. Please let know of any abnormal activity concerning for a seizure. ______________________________________________________________________   Thank you for the opportunity to take part in the care of this patient. If you have any further questions, please contact the neurology consultation attending.  Signed,  [facility] Pager Number 

## 2020-03-15 NOTE — Progress Notes (Signed)
SLP Cancellation Note  Patient Details Name: Damon Colon MRN: [id number] DOB: 1940/11/05   Cancelled treatment:       Reason Eval/Treat Not Completed: Medical issues which prohibited therapy. Consulted with RN. Pt currently requiring nonrebreather. SLP will continue efforts to evaluate swallow safety and identify least restrictive diet.  Angelena Sand B. Murvin Natal, [facility], CCC-SLP Speech Language Pathologist Office: [phone]  Leigh Aurora 03/15/2020, 1:13 PM

## 2020-03-15 NOTE — Progress Notes (Signed)
Late Entry: LTM maint complete - no skin breakdown under: o1,o2,c4,p8,p7

## 2020-03-15 NOTE — Procedures (Addendum)
Patient Name: Damon Colon  MRN: [id number]  Epilepsy Attending: Charlsie Quest  Referring Physician/Provider: Dr Erick Blinks Duration: 03/09/2020 1448 to 03/15/2020 1326  Patient history: 79yo M with ams and right gaze deviation. EEf to evaluate for seizure.  Level of alertness: awake, asleep  AEDs during EEG study: None  Technical aspects: This EEG study was done with scalp electrodes positioned according to the 10-20 International system of electrode placement. Electrical activity was acquired at a sampling rate of 500Hz  and reviewed with a high frequency filter of 70Hz  and a low frequency filter of 1Hz . EEG data were recorded continuously and digitally stored.   Description: No posterior dominant rhythm was seen.Sleep was characterized by vertex waves,sleep spindles (12-14hz ), maximal frontocentral region.  EEG showed continuous generalized 3 to 6 Hz theta-delta slowing. Left temporal spikes were also noted. Hyperventilation and photic stimulation were not performed.     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 No seizures were seen throughout the recording.  [name] 

## 2020-03-16 DIAGNOSIS — Z515 Encounter for palliative care: Secondary | ICD-10-CM | POA: Diagnosis not present

## 2020-03-16 DIAGNOSIS — J690 Pneumonitis due to inhalation of food and vomit: Secondary | ICD-10-CM | POA: Diagnosis not present

## 2020-03-16 DIAGNOSIS — Z7189 Other specified counseling: Secondary | ICD-10-CM

## 2020-03-16 DIAGNOSIS — G8250 Quadriplegia, unspecified: Secondary | ICD-10-CM | POA: Diagnosis not present

## 2020-03-16 LAB — CBC
HCT: 33.9 % — ABNORMAL LOW (ref 39.0–52.0)
Hemoglobin: 10.4 g/dL — ABNORMAL LOW (ref 13.0–17.0)
MCH: 30.4 pg (ref 26.0–34.0)
MCHC: 30.7 g/dL (ref 30.0–36.0)
MCV: 99.1 fL (ref 80.0–100.0)
Platelets: 474 10*3/uL — ABNORMAL HIGH (ref 150–400)
RBC: 3.42 MIL/uL — ABNORMAL LOW (ref 4.22–5.81)
RDW: 13.8 % (ref 11.5–15.5)
WBC: 42.6 10*3/uL — ABNORMAL HIGH (ref 4.0–10.5)
nRBC: 0 % (ref 0.0–0.2)

## 2020-03-16 LAB — COMPREHENSIVE METABOLIC PANEL
ALT: 20 U/L (ref 0–44)
AST: 24 U/L (ref 15–41)
Albumin: 2.6 g/dL — ABNORMAL LOW (ref 3.5–5.0)
Alkaline Phosphatase: 51 U/L (ref 38–126)
Anion gap: 15 (ref 5–15)
BUN: 18 mg/dL (ref 8–23)
CO2: 23 mmol/L (ref 22–32)
Calcium: 9.4 mg/dL (ref 8.9–10.3)
Chloride: 109 mmol/L (ref 98–111)
Creatinine, Ser: 0.62 mg/dL (ref 0.61–1.24)
GFR, Estimated: >60 mL/min (ref >60)
Glucose, Bld: 117 mg/dL — ABNORMAL HIGH (ref 70–99)
Potassium: 3 mmol/L — ABNORMAL LOW (ref 3.5–5.1)
Sodium: 147 mmol/L — ABNORMAL HIGH (ref 135–145)
Total Bilirubin: 1.1 mg/dL (ref 0.3–1.2)
Total Protein: 6.6 g/dL (ref 6.5–8.1)

## 2020-03-16 LAB — HEPARIN LEVEL (UNFRACTIONATED): Heparin Unfractionated: 0.48 IU/mL (ref 0.30–0.70)

## 2020-03-16 LAB — URINE CULTURE: Culture: >=100000 — AB

## 2020-03-16 LAB — APTT: aPTT: 63 seconds — ABNORMAL HIGH (ref 24–36)

## 2020-03-16 LAB — MRSA PCR SCREENING: MRSA by PCR: POSITIVE — AB

## 2020-03-16 MED ORDER — GLYCOPYRROLATE 0.2 MG/ML IJ SOLN
0.2000 mg | INTRAMUSCULAR | Status: DC | PRN
  Administered 2020-03-16: 0.2 mg via INTRAVENOUS
  Filled 2020-03-16: qty 1

## 2020-03-16 MED ORDER — GLYCOPYRROLATE 0.2 MG/ML IJ SOLN: 0.2000 mg | INTRAMUSCULAR | Status: DC | PRN

## 2020-03-16 MED ORDER — ACETAMINOPHEN 650 MG RE SUPP: 650.0000 mg | Freq: Four times a day (QID) | RECTAL | Status: DC | PRN

## 2020-03-16 MED ORDER — LORAZEPAM 2 MG/ML PO CONC: 1.0000 mg | ORAL | Status: DC | PRN

## 2020-03-16 MED ORDER — LORAZEPAM 2 MG/ML IJ SOLN: 1.0000 mg | INTRAMUSCULAR | Status: DC | PRN

## 2020-03-16 MED ORDER — BIOTENE DRY MOUTH MT LIQD: 15.0000 mL | OROMUCOSAL | Status: DC | PRN

## 2020-03-16 MED ORDER — POLYVINYL ALCOHOL 1.4 % OP SOLN
1.0000 [drp] | Freq: Four times a day (QID) | OPHTHALMIC | Status: DC | PRN
  Filled 2020-03-16: qty 15

## 2020-03-16 MED ORDER — MUPIROCIN 2 % EX OINT
1.0000 "application " | TOPICAL_OINTMENT | Freq: Two times a day (BID) | CUTANEOUS | Status: DC
  Administered 2020-03-16: 1 via NASAL
  Filled 2020-03-16: qty 22

## 2020-03-16 MED ORDER — LORAZEPAM 1 MG PO TABS: 1.0000 mg | ORAL_TABLET | ORAL | Status: DC | PRN

## 2020-03-16 MED ORDER — CHLORHEXIDINE GLUCONATE CLOTH 2 % EX PADS
6.0000 | MEDICATED_PAD | Freq: Every day | CUTANEOUS | Status: DC
  Administered 2020-03-16: 6 via TOPICAL

## 2020-03-16 NOTE — Progress Notes (Addendum)
Floor coverage progress note  Notified by RN that patient has had a few intermittent episodes of apnea.  Patient was seen. Awake but not fully alert.  Heart rate in the low 100s, respiratory rate in the mid 20s, satting 99% on 6 L supplemental oxygen, blood pressure 148/56.   Patient has already been seen by palliative care and they have introduced the concept of comfort path to his sister.  Per note, his sister was very receptive to comfort care and hospice.  Plan was to continue supportive care for now until he can be transitioned to home hospice care.  Will continue to monitor closely and notify his sister if his clinical status continues to decline.  Addendum: I went back into the patient's room.  Vital signs same as above.  Extremities warm to touch.  He appears more alert now.  He opened his eyes and nodded when his name was called.

## 2020-03-16 NOTE — Evaluation (Signed)
Clinical/Bedside Swallow Evaluation Patient Details  Name: MINNIE SHI MRN: [id number] Date of Birth: Jun 02, 1940  Today's Date: 03/16/2020 Time: SLP Start Time (ACUTE ONLY): 1601 SLP Stop Time (ACUTE ONLY): 0923 SLP Time Calculation (min) (ACUTE ONLY): 7 min  Past Medical History:  Past Medical History:  Diagnosis Date  . Arthritis   . Bruises easily   . Fever    102  . Hypertension   . Paraplegia (HCC)   . Polio   . 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 Wears glasses    Past Surgical History:  Past Surgical History:  Procedure Laterality Date  . APPENDECTOMY    . COLONOSCOPY    . IR CATHETER TUBE CHANGE  09/01/2019  . NECK SURGERY     c7- titanium plates put in  . SPINE SURGERY     3-stage spine fusion  . TONSILLECTOMY AND ADENOIDECTOMY    . URETHRAL DIVERTICULECTOMY     HPI:  DERAK SCHURMAN is a 79 y.o. 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 Per chart, tient was eating eggs when he choked, became unresponsive, CPR initiated. Pt is followed by palliative care however he is not on hospice, per chart. CXR hyperinflation/COPD. Patchy bilateral perihilar opacities concerning for pneumonia. No prior ST notes.    Assessment / Plan / Recommendation Clinical Impression  Cachectic appearing pt encountered with open mouth breathing, eyes closed and using accessory muscle respiration pattern. Slight eye opening with verbal and tactile stimuli but essentially unresponsive to commands. Inadequate saliva and soft palate mucous and dried labial skin removed. Respirations 33, HR 113 and SpO2 93%. Noted Palliative care documentation that family may lean towards comfort care. Recommend that oral care is continued for moisture and comfort and remain NPO due to unresponsive  state. If he becomes more alert ice chips could be considered. ST will sign off at this point.     SLP Visit Diagnosis: Dysphagia, unspecified (R13.10)    Aspiration Risk  Severe aspiration risk;Risk for inadequate nutrition/hydration    Diet Recommendation NPO        Other  Recommendations Oral Care Recommendations: Oral care QID   Follow up Recommendations None      Frequency and Duration            Prognosis        Swallow Study   General Date of Onset: 03/19/2020 HPI: KREE RAFTER is a 79 y.o. 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 Per chart, tient was eating eggs when he choked, became unresponsive, CPR initiated. Pt is followed by palliative care however he is not on hospice, per chart. CXR hyperinflation/COPD. Patchy bilateral perihilar opacities concerning for pneumonia. No prior ST notes.  Type of Study: Bedside Swallow Evaluation Previous Swallow Assessment:  (none) Diet Prior to this Study: NPO Temperature Spikes Noted: No Respiratory Status: Nasal cannula History of Recent Intubation: No Behavior/Cognition: Lethargic/Drowsy;Doesn't follow directions Oral Cavity Assessment: Dry;Dried secretions Oral Care Completed by SLP: Yes Oral Cavity - Dentition: Poor condition;Missing dentition Vision: Functional for self-feeding Patient Positioning: Upright in bed Baseline Vocal Quality: Other (comment) (no vocalizations) Volitional Cough: Other (Comment) (not following directions) Volitional Swallow: Unable to elicit    Oral/Motor/Sensory  Function Overall Oral Motor/Sensory Function: Generalized oral weakness   Ice Chips Ice chips: Not tested   Thin Liquid Thin Liquid: Not tested    Nectar Thick Nectar Thick Liquid: Not tested   Honey Thick Honey Thick Liquid:  Not tested   Puree Puree: Not tested   Solid     Solid: Not tested      Royce Macadamia 03/16/2020,9:41 AM  Breck Coons Lonell Face.Ed Nurse, children's [phone] Office [phone]

## 2020-03-16 NOTE — Progress Notes (Signed)
Noted patient having periods of shallow breathing and apnea, MD notified.

## 2020-03-16 NOTE — Progress Notes (Addendum)
This chaplain responded to PMT consult for EOL spiritual care.  The chaplain read the Epic notes and understands the Pt. sister-Nancy will not be visiting. Possibly the Pt. Caregiver-Lita will visit. The chaplain was pastorally present bedside with the Pt., honoring the Pt. life with silent prayer.  This chaplain is available for F/U spiritual care as needed.  *This chaplain met Lita in the hall as she was signing into the unit.  The chaplain listened as Victorio Palm shared stories of her relationship with the Pt. The chaplain understands Victorio Palm is willing to let the Pt. and the Pt. Sister-Nancy lean on her during this journey.

## 2020-03-16 NOTE — Progress Notes (Signed)
Patient arrived to room 2w06 from ED.  Assessment complete, VS obtained, and Admission database began. 

## 2020-03-16 NOTE — Progress Notes (Signed)
OT Cancellation Note  Patient Details Name: Damon Colon MRN: [id number] DOB: [birth year]   Cancelled Treatment:    Reason Eval/Treat Not Completed: Other (comment)-OT screened, spoke to RN and plan for transition to comfort care with no OT needs at this time.  If further needs arise, please re-consult.   Barry Brunner, OT Acute Rehabilitation Services Pager [phone] Office [phone]   Chancy Milroy 03/16/2020, 10:03 AM

## 2020-03-16 NOTE — Progress Notes (Signed)
CRITICAL VALUE ALERT  Critical Value:  mrsa swab +  Date & Time Notied:  03/16/20 06:36  Provider Notified: Loney Loh  Orders Received/Actions taken: per protocol.

## 2020-03-16 NOTE — Progress Notes (Signed)
ANTICOAGULATION CONSULT NOTE   Pharmacy Consult for heparin gtt Indication: history of DVT  Assessment: 79 year old male presenting with AMS, on Xarelto PTA with last dose taken 10/16 ~1800.  Will follow aPTT until Anti-Xa level correlates.    APTT 63 sec   Goal of Therapy:  Heparin level 0.3-0.7 units/ml  APTT 66-102 seconds Monitor platelets by anticoagulation protocol: Yes   Plan:  Increase heparin to 650 units/hr F/u  APTT and heparin level 6-8 hours F/u ability to transition back to PO anticoagulation  Talbert Cage, PharmD 03/16/2020,6:31 AM

## 2020-03-16 NOTE — Progress Notes (Signed)
Called Sister Harriett Sine and gave update that patient looks like he may pass soon, and that she should come and visit if she wanted. She stated that his caregiver was coming in. Patient made comfort care, cardiac monitoring discontinued. Gave Morphine and Robinul for comfort as ordered. Will continue to monitor.

## 2020-03-16 NOTE — Progress Notes (Signed)
PT Cancellation Note  Patient Details Name: Damon Colon MRN: [id number] DOB: [birth year]   Cancelled Treatment:    Reason Eval/Treat Not Completed: PT screened, no needs identified, will sign off (pt with plans to transition to comfort care and not appropriate for therapy at this time, discussed and confirmed with RN)   Enedina Finner Arhum Peeples 03/16/2020, 9:19 AM Merryl Hacker, PT Acute Rehabilitation Services Pager: [phone] Office: [phone]

## 2020-03-16 NOTE — Progress Notes (Signed)
Noted change to comfort care, we will be available as needed.   Ritta Slot, MD [facility] [phone]  If 7pm- 7am, please page neurology on call as listed in AMION.

## 2020-03-16 NOTE — Progress Notes (Signed)
PROGRESS NOTE    Damon Colon  TML:[id number] DOB: 1940-10-04 DOA: 03/11/2020 PCP: Catha Gosselin, MD  Brief Narrative: This is a chronically ill extremely debilitated 79/M with history of polio, post poliomyelitis, severe muscular atrophy, quadriplegia, urinary retention chronic Foley, contracted extremities, DVT was brought to the emergency room 10/17 with decreased responsiveness, choking and shortness of breath. -In the emergency room he was noted to be febrile to 101, hypoxic requiring a nonrebreather mask, WBC of 13.6, lactic acid of 4.0, chest x-ray concerning for right lower lobe pneumonia possibly aspiration, EDP also asked neurology to see him due to right eye deviation and decreased responsiveness yesterday he was given a dose of Keppra Ativan, IVF and antibiotics   Assessment & Plan:    Sepsis, poa Acute hypoxic respiratory failure Aspiration pneumonia -Patient is extremely debilitated quadriplegic, severe muscular atrophy and dysphagia -Initially treated with IV Unasyn, antibiotics, white count up to 42K, but poorly responsive and tachypneic -Palliative care also consulted and following, prognosis is poor -followed by palliative care at home, according to sister he is cared for 24/7 by caregivers he has been declining, losing weight and has been bedbound for the past several months, initially hospice at home was being considered however with further decline he has just been transitioned to comfort measures, anticipate hospital death  Toxic encephalopathy -This is likely secondary to sepsis, aspiration pneumonia -EEG  w/ temporal lobe spike, started on keppra per Neuro -now comfort care  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 -Now comfort care  History of neurogenic bladder -Has a chronic Foley  History of DVT -On Xarelto at baseline, was started on heparin on admission, will discontinue  Severe protein calorie malnutrition  DVT  prophylaxis: SCDs CODE STATUS: DNR  Family communication: No family at bedside, updated Sister Harriett Sine 10/18 Dispo:  Remains inpatient appropriate because:Inpatient level of care appropriate due to severity of illness   Dispo: The patient is from: Home              Anticipated d/c is to: Anticipate hospital death              Anticipated d/c date is:  Hours to days              Patient currently is not medically stable to d/c.    Consultants:  SLP Palliative   Procedures:   Antimicrobials:    Subjective: -Poorly responsive, had apneic spells overnight, remains tachypneic  Objective: Vitals:   03/16/20 0445 03/16/20 0615 03/16/20 0800 03/16/20 1144  BP: (!) 135/54 (!) 123/52 (!) 135/59 (!) 125/55  Pulse: (!) 104 (!) 111 (!) 109 (!) 111  Resp: (!) 22 (!) 22 (!) 21 (!) 30  Temp:  99.9 F (37.7 C)  99.8 F (37.7 C)  TempSrc:  Axillary  Axillary  SpO2: 100% 100% 97% 95%  Weight:      Height:        Intake/Output Summary (Last 24 hours) at 03/16/2020 1404 Last data filed at 03/16/2020 0900 Gross per 24 hour  Intake 3249.99 ml  Output 0 ml  Net 3249.99 ml   Filed Weights   02/27/2020 2155 03/16/20 0308  Weight: 72.6 kg 44.3 kg    Examination:  General exam: Chronically ill extremely cachectic male laying in bed, poorly responsive, opens eyes to painful stimuli CVS: S1-S2, regular rhythm, tachycardic Lungs: Scattered rhonchi bilaterally and conducted upper airway sounds Abdomen: Soft, nontender, Foley catheter with concentrated urine Extremities contractures, extremely atrophied muscles  Neuro,  awake and alert, unable to assess orientation, quadriplegic  Data Reviewed:   CBC: Recent Labs  Lab 03/15/20 1015 03-15-20 2242 03/15/20 0909 03/16/20 0535  WBC 13.6*  --  39.5* 42.6*  NEUTROABS 6.3  --   --   --   HGB 12.5* 12.6* 11.5* 10.4*  HCT 41.2 37.0* 36.8* 33.9*  MCV 101.7*  --  98.4 99.1  PLT 429*  --  471* 474*   Basic Metabolic Panel: Recent Labs   Lab 2020-03-15 1015 2020-03-15 2242 03/15/20 0358 03/16/20 0535  NA 134* 135 140 147*  K 4.9 3.7 3.6 3.0*  CL 99  --  102 109  CO2 22  --  25 23  GLUCOSE 147*  --  127* 117*  BUN 15  --  12 18  CREATININE 0.43*  --  0.43* 0.62  CALCIUM 9.8  --  9.2 9.4  MG 2.2  --   --   --    GFR: Estimated Creatinine Clearance: 46.1 mL/min (by C-G formula based on SCr of 0.62 mg/dL). Liver Function Tests: Recent Labs  Lab 03/15/20 0358 03/16/20 0535  AST 36 24  ALT 22 20  ALKPHOS 54 51  BILITOT 0.7 1.1  PROT 6.9 6.6  ALBUMIN 2.8* 2.6*   No results for input(s): LIPASE, AMYLASE in the last 168 hours. Recent Labs  Lab 03-15-2020 1829  AMMONIA 21   Coagulation Profile: Recent Labs  Lab 03/15/20 1042  INR 1.4*   Cardiac Enzymes: No results for input(s): CKTOTAL, CKMB, CKMBINDEX, TROPONINI in the last 168 hours. BNP (last 3 results) No results for input(s): PROBNP in the last 8760 hours. HbA1C: No results for input(s): HGBA1C in the last 72 hours. CBG: No results for input(s): GLUCAP in the last 168 hours. Lipid Profile: No results for input(s): CHOL, HDL, LDLCALC, TRIG, CHOLHDL, LDLDIRECT in the last 72 hours. Thyroid Function Tests: Recent Labs    15-Mar-2020 1829  TSH 0.700   Anemia Panel: Recent Labs    2020/03/15 1829  VITAMINB12 995*  FOLATE 47.2   Urine analysis:    Component Value Date/Time   COLORURINE RED (A) March 15, 2020 1830   APPEARANCEUR TURBID (A) 03/15/2020 1830   LABSPEC  Mar 15, 2020 1830    TEST NOT REPORTED DUE TO COLOR INTERFERENCE OF URINE PIGMENT   PHURINE  03-15-20 1830    TEST NOT REPORTED DUE TO COLOR INTERFERENCE OF URINE PIGMENT   GLUCOSEU (A) 2020-03-15 1830    TEST NOT REPORTED DUE TO COLOR INTERFERENCE OF URINE PIGMENT   HGBUR (A) Mar 15, 2020 1830    TEST NOT REPORTED DUE TO COLOR INTERFERENCE OF URINE PIGMENT   BILIRUBINUR (A) 03/15/2020 1830    TEST NOT REPORTED DUE TO COLOR INTERFERENCE OF URINE PIGMENT   KETONESUR (A) Mar 15, 2020 1830     TEST NOT REPORTED DUE TO COLOR INTERFERENCE OF URINE PIGMENT   PROTEINUR (A) March 15, 2020 1830    TEST NOT REPORTED DUE TO COLOR INTERFERENCE OF URINE PIGMENT   NITRITE (A) 03-15-2020 1830    TEST NOT REPORTED DUE TO COLOR INTERFERENCE OF URINE PIGMENT   LEUKOCYTESUR (A) 03/15/2020 1830    NO FORMED ELEMENTS SEEN ON URINE MICROSCOPIC EXAMINATION   Sepsis Labs: @LABRCNTIP (procalcitonin:4,lacticidven:4)  ) Recent Results (from the past 240 hour(s))  Blood culture (routine x 2)     Status: None (Preliminary result)   Collection Time: 2020-03-15 10:42 AM   Specimen: BLOOD  Result Value Ref Range Status   Specimen Description BLOOD RIGHT ANTECUBITAL  Final  Special Requests   Final    BOTTLES DRAWN AEROBIC AND ANAEROBIC Blood Culture results may not be optimal due to an excessive volume of blood received in culture bottles   Culture   Final    NO GROWTH 2 DAYS Performed at [facility] Lab, [address] [place], [place] [address]    Report Status PENDING  Incomplete  Blood culture (routine x 2)     Status: None (Preliminary result)   Collection Time: Mar 18, 2020 10:55 AM   Specimen: BLOOD RIGHT WRIST  Result Value Ref Range Status   Specimen Description BLOOD RIGHT WRIST  Final   Special Requests   Final    BOTTLES DRAWN AEROBIC AND ANAEROBIC Blood Culture results may not be optimal due to an inadequate volume of blood received in culture bottles   Culture   Final    NO GROWTH 2 DAYS Performed at [facility], [address], [place], [place] [address]    Report Status PENDING  Incomplete  Urine culture     Status: Abnormal   Collection Time: 2020/03/18 11:20 AM   Specimen: In/Out Cath Urine  Result Value Ref Range Status   Specimen Description IN/OUT CATH URINE  Final   Special Requests   Final    NONE Performed at [facility], [address], [place], [place] [address]    Culture >=100,000 COLONIES/mL PSEUDOMONAS AERUGINOSA (A)  Final   Report Status  03/16/2020 FINAL  Final   Organism ID, Bacteria PSEUDOMONAS AERUGINOSA (A)  Final      Susceptibility   Pseudomonas aeruginosa - MIC*    CEFTAZIDIME 4 SENSITIVE Sensitive     CIPROFLOXACIN <=0.25 SENSITIVE Sensitive     GENTAMICIN <=1 SENSITIVE Sensitive     IMIPENEM 2 SENSITIVE Sensitive     PIP/TAZO 16 SENSITIVE Sensitive     CEFEPIME 2 SENSITIVE Sensitive     * >=100,000 COLONIES/mL PSEUDOMONAS AERUGINOSA  Respiratory Panel by RT PCR (Flu A&B, Covid) - Nasopharyngeal Swab     Status: None   Collection Time: 2020/03/18 11:25 AM   Specimen: Nasopharyngeal Swab  Result Value Ref Range Status   SARS Coronavirus 2 by RT PCR NEGATIVE NEGATIVE Final    Comment: (NOTE) SARS-CoV-2 target nucleic acids are NOT DETECTED.  The SARS-CoV-2 RNA is generally detectable in upper respiratoy specimens during the acute phase of infection. The lowest concentration of SARS-CoV-2 viral copies this assay can detect is 131 copies/mL. A negative result does not preclude SARS-Cov-2 infection and should not be used as the sole basis for treatment or other patient management decisions. A negative result may occur with  improper specimen collection/handling, submission of specimen other than nasopharyngeal swab, presence of viral mutation(s) within the areas targeted by this assay, and inadequate number of viral copies (<131 copies/mL). A negative result must be combined with clinical observations, patient history, and epidemiological information. The expected result is Negative.  Fact Sheet for Patients:  https://www.moore.com/  Fact Sheet for Healthcare Providers:  https://www.young.biz/  This test is no t yet approved or cleared by the [place] FDA and  has been authorized for detection and/or diagnosis of SARS-CoV-2 by FDA under an Emergency Use Authorization (EUA). This EUA will remain  in effect (meaning this test can be used) for the duration of  the COVID-19 declaration under Section 564(b)(1) of the Act, 21 U.S.C. section 360bbb-3(b)(1), unless the authorization is terminated or revoked sooner.     Influenza A by PCR NEGATIVE NEGATIVE  Final   Influenza B by PCR NEGATIVE NEGATIVE Final    Comment: (NOTE) The Xpert Xpress SARS-CoV-2/FLU/RSV assay is intended as an aid in  the diagnosis of influenza from Nasopharyngeal swab specimens and  should not be used as a sole basis for treatment. Nasal washings and  aspirates are unacceptable for Xpert Xpress SARS-CoV-2/FLU/RSV  testing.  Fact Sheet for Patients: https://www.moore.com/  Fact Sheet for Healthcare Providers: https://www.young.biz/  This test is not yet approved or cleared by the [place] FDA and  has been authorized for detection and/or diagnosis of SARS-CoV-2 by  FDA under an Emergency Use Authorization (EUA). This EUA will remain  in effect (meaning this test can be used) for the duration of the  Covid-19 declaration under Section 564(b)(1) of the Act, 21  U.S.C. section 360bbb-3(b)(1), unless the authorization is  terminated or revoked. Performed at [facility], [address] [place], [place] [address]   MRSA PCR Screening     Status: Abnormal   Collection Time: 03/16/20  2:47 AM   Specimen: Nasal Mucosa; Nasopharyngeal  Result Value Ref Range Status   MRSA by PCR POSITIVE (A) NEGATIVE Final    Comment:        The GeneXpert MRSA Assay (FDA approved for NASAL specimens only), is one component of a comprehensive MRSA colonization surveillance program. It is not intended to diagnose MRSA infection nor to guide or monitor treatment for MRSA infections. RESULT CALLED TO, READ BACK BY AND VERIFIED WITH: MARSH,M RN 03/16/2020 AT 6045 SKEEN,P Performed at [facility] Lab, [address], [place], [place] [address]     Radiology Studies: DG CHEST PORT 1 VIEW  Result Date: 03/01/2020 CLINICAL DATA:   Hypoxia EXAM: PORTABLE CHEST 1 VIEW COMPARISON:  03/03/2020 FINDINGS: Heart is normal size. There is hyperinflation of the lungs compatible with COPD. Aortic atherosclerosis. Patchy bilateral perihilar opacities. No effusions. No acute bony abnormality. IMPRESSION: Hyperinflation/COPD. Patchy bilateral perihilar opacities concerning for pneumonia. Electronically Signed   By: Charlett Nose M.D.   On: 03/13/2020 22:41   EEG adult  Result Date: 03/07/2020 Charlsie Quest, MD     03/15/2020  9:10 AM Patient Name: BRITNEY CAPTAIN MRN: [id number] Epilepsy Attending: Charlsie Quest Referring Physician/Provider: Dr Erick Blinks Date: 03/08/2020 Duration: 24.36 mins Patient history: 79yo M with ams and right gaze deviation. EEf to evaluate for seizure. Level of alertness: comatose AEDs during EEG study: None Technical aspects: This EEG study was done with scalp electrodes positioned according to the 10-20 International system of electrode placement. Electrical activity was acquired at a sampling rate of  and reviewed with a high frequency filter of  and a low frequency filter of . EEG data were recorded continuously and digitally stored. Description: No posterior dominant rhythm was seen. EEG showed continuous generalized 3 to 6 Hz theta-delta slowing. Hyperventilation and photic stimulation were not performed.   ABNORMALITY -Continuous slow, generalized IMPRESSION: This study is suggestive of severe diffuse encephalopathy, nonspecific etiology. No seizures or epileptiform discharges were seen throughout the recording. Priyanka Annabelle Harman   Overnight EEG with video  Result Date: 03/15/2020 Charlsie Quest, MD     03/15/2020  2:26 PM Patient Name: JOHNMATTHEW SOLORIO MRN: [id number] Epilepsy Attending: Charlsie Quest Referring Physician/Provider: Dr Erick Blinks Duration: 02/29/2020 1448 to 03/15/2020 1326  Patient history: 79yo M with ams and right gaze deviation. EEf to evaluate for seizure.   Level of alertness: awake, asleep  AEDs during EEG study:  None  Technical aspects: This EEG study was done with scalp electrodes positioned according to the 10-20 International system of electrode placement. Electrical activity was acquired at a sampling rate of 500Hz  and reviewed with a high frequency filter of 70Hz  and a low frequency filter of 1Hz . EEG data were recorded continuously and digitally stored.  Description: No posterior dominant rhythm was seen.Sleep was characterized by vertex waves,sleep spindles (12-14hz ), maximal frontocentral region.  EEG showed continuous generalized 3 to 6 Hz theta-delta slowing. Left temporal spikes were also noted. Hyperventilation and photic stimulation were not performed.    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 No seizures were seen throughout the recording.  Priyanka Annabelle Harman Yadav        Scheduled Meds: Continuous Infusions: . levETIRAcetam 500 mg (03/16/20 0310)     LOS: 2 days    Time spent: 25min  Zannie CovePreetha Kewanna Kasprzak, MD [facility]  03/16/2020, 2:04 PM

## 2020-03-16 NOTE — Progress Notes (Signed)
Daily Progress Note   Patient Name: Damon Colon       Date: 03/16/2020 DOB: [birth year]  Age: 79 y.o. MRN#: [id number] Attending Physician: Zannie Cove, MD Primary Care Physician: Catha Gosselin, MD Admit Date: March 23, 2020  Reason for Follow-up: continued GOC discussion, terminal care, psychosocial support  Subjective: Patient is unresponsive to voice and light touch. Oxygen currently at 6L via nasal cannula.   I spoke with sister Harriett Sine by phone and provided update that patient has declined over night. I gently informed her that patient appears to be actively dying despite current interventions. Discussed that the antibiotics and IV fluids are not helping - recommended stopping current interventions and transitioning to full comfort care. Harriett Sine verbalizes understanding and acceptance that patient is at end of life and agrees with transition   Discussed in detail transitioning to comfort care while in the hospital, and what that would look like--keeping him clean and dry, no labs, no artificial hydration or feeding, no antibiotics, minimizing of medications, medication for pain and dyspnea.   Provided education and counseling on expectations at EOL. Harriett Sine becomes tearful at the end of our conversation - states she struggles with clinical depression. She does not plan to visit, states it is too difficult for her. Emotional support provided. Harriett Sine states that patient's caregiver Lita plans to visit sometime today.    Length of Stay: 2  Current Medications: Scheduled Meds:    Continuous Infusions: . levETIRAcetam 500 mg (03/16/20 0310)    PRN Meds: acetaminophen **OR** acetaminophen, antiseptic oral rinse, glycopyrrolate **OR** glycopyrrolate, LORazepam **OR** LORazepam **OR**  LORazepam, morphine injection, ondansetron **OR** ondansetron (ZOFRAN) IV, polyvinyl alcohol  Physical Exam Constitutional:      General: He is not in acute distress.    Appearance: He is ill-appearing.  HENT:     Head: Normocephalic and atraumatic.  Cardiovascular:     Rate and Rhythm: Tachycardia present.  Neurological:     Mental Status: He is unresponsive.             Vital Signs: BP (!) 125/55 (BP Location: Left Arm)   Pulse (!) 111   Temp 99.8 F (37.7 C) (Axillary)   Resp (!) 30   Ht 5\' 8"  (1.727 m)   Wt 44.3 kg   SpO2 95%   BMI 14.85 kg/m  SpO2:  SpO2: 95 % O2 Device: O2 Device: Nasal Cannula O2 Flow Rate: O2 Flow Rate (L/min): 6 L/min  Intake/output summary:   Intake/Output Summary (Last 24 hours) at 03/16/2020 1235 Last data filed at 03/16/2020 0900 Gross per 24 hour  Intake 3249.99 ml  Output 0 ml  Net 3249.99 ml   LBM:   Baseline Weight: Weight: 72.6 kg Most recent weight: Weight: 44.3 kg       Palliative Assessment/Data: PPS 10%      Palliative Care Assessment & Plan   HPI/Patient Profile:79 y.o.malewith past medical history of polio, postpoliomyelitis muscular atrophy, quadriplegia, bladder spasms, contractures to all extremities, DVT, hypertension,cervical stenosis presented to the ED on Mar 31, 2020 after choking on eggs at breakfast and becoming unresponsiveat home- CPR wasinitiatedby home health aid and EMS was called. ED Course: Upon arrival to ED: Patient temperature was noted 36.1, hypoxemic requiring nonrebreather, leukocytosis of 13.6-chronic, MCV: 101.7, platelet: 427, lactic acid of 4.0, CT head came back negative, UA, UC, BC: Pending. COVID-19 negative. Chest x-ray concerning for right lung pneumonia concerning for aspiration. EDP consulted neurology as patient had eye deviation to the right-concerning for seizures like episode. Patient was given IV fluid of 500 cc, Ativan, Keppra of 1000 mg, Rocephin and azithromycin in ED. [facility] consulted for admission for altered mental status/aspiration pneumonia.  Assessment: - aspiration pneumonia - acute hypoxic respiratory failure with hypoxia  - toxic encephalopathy - post poliomyelitis with severe muscular atrophy - quadriplegia  Recommendations/Plan: - transition to full comfort care - D/C labs - D/C antibiotics and IV fluids - minimize medications - titrate down oxygen as tolerated for comfort (see below)  Do not check sats. May give benzos and/or opioids for shortness of breath, dyspnea, increased work of breathing or respiratory rate over 22.   Symptom Management:  Per end of life order set Morphine prn for pain or shortness of breath Lorazepam (ATIVAN) prn for anxiety Glycopyrrolate (ROBINUL) for excessive secretions Ondansetron (ZOFRAN) prn for nausea   Goals of Care and Additional Recommendations: Limitations on Scope of Treatment: Full Comfort Care  Code Status: DNR/DNI  Prognosis:  Hours - Days  Discharge Planning: Anticipated Hospital Death  Care plan was discussed with Dr. Jomarie Longs, bedside RN  Thank you for allowing the Palliative Medicine Team to assist in the care of this patient.   Total Time 35 minutes Prolonged Time Billed  no       Greater than 50%  of this time was spent counseling and coordinating care related to the above assessment and plan.  Merry Proud, NP  Please contact Palliative Medicine Team phone at [phone] for questions and concerns.

## 2020-03-16 NOTE — ED Notes (Signed)
Pt incontinent of loose stool.  Pt cleaned and turned

## 2020-03-17 DIAGNOSIS — Z515 Encounter for palliative care: Secondary | ICD-10-CM | POA: Diagnosis not present

## 2020-03-17 DIAGNOSIS — J690 Pneumonitis due to inhalation of food and vomit: Secondary | ICD-10-CM | POA: Diagnosis not present

## 2020-03-17 DIAGNOSIS — J9601 Acute respiratory failure with hypoxia: Secondary | ICD-10-CM | POA: Diagnosis not present

## 2020-03-19 LAB — CULTURE, BLOOD (ROUTINE X 2)
Culture: NO GROWTH
Culture: NO GROWTH

## 2020-03-29 NOTE — Progress Notes (Signed)
Daily Progress Note   Patient Name: Damon Colon       Date: 03/09/2020 DOB: [birth year]  Age: 79 y.o. MRN#: [id number] Attending Physician: Lorin Glass, MD Primary Care Physician: Catha Gosselin, MD Admit Date: 04/07/20  Reason for Consultation/Follow-up: End of life care, symptom check  Subjective: Patient appears comfortable. Shallow respirations noted, with periods of apnea. O2 is at 3L. Patient is unresponsive to voice and light touch.   I spoke with sister by phone, providing reassurance that patient was comfortable. Also providing counseling on EOL process and that I anticipated he would pass in the next few hours.   Length of Stay: 3  Current Medications: Scheduled Meds:    Continuous Infusions: . levETIRAcetam Stopped (03/11/2020 1315)    PRN Meds: acetaminophen **OR** acetaminophen, antiseptic oral rinse, glycopyrrolate **OR** glycopyrrolate, LORazepam **OR** LORazepam **OR** LORazepam, morphine injection, ondansetron **OR** ondansetron (ZOFRAN) IV, polyvinyl alcohol        Vital Signs: BP (!) 67/38 (BP Location: Left Arm)   Pulse 96   Temp 98.5 F (36.9 C) (Axillary)   Resp (!) 29   Ht 5\' 8"  (1.727 m)   Wt 44.3 kg   SpO2 98%   BMI 14.85 kg/m  SpO2: SpO2: 98 % O2 Device: O2 Device: Nasal Cannula O2 Flow Rate: O2 Flow Rate (L/min): 6 L/min  Intake/output summary:   Intake/Output Summary (Last 24 hours) at 03/10/2020 1732 Last data filed at 03/16/2020 1908 Gross per 24 hour  Intake --  Output 450 ml  Net -450 ml   LBM:   Baseline Weight: Weight: 72.6 kg Most recent weight: Weight: 44.3 kg       Palliative Assessment/Data: PPS 10%      Palliative Care Assessment & Plan   HPI/Patient Profile:79 y.o.malewith past medical history of polio,  postpoliomyelitis muscular atrophy, quadriplegia, bladder spasms, contractures to all extremities, DVT, hypertension,cervical stenosis presented to the ED on 2020/04/07 after choking on eggs at breakfast and becoming unresponsiveat home- CPR wasinitiatedby home health aid and EMS was called. ED Course: Upon arrival to ED: Patient temperature was noted 36.1, hypoxemic requiring nonrebreather, leukocytosis of 13.6-chronic, MCV: 101.7, platelet: 427, lactic acid of 4.0, CT head came back negative, UA, UC, BC: Pending. COVID-19 negative. Chest x-ray concerning for right lung pneumonia concerning for aspiration. EDP consulted neurology as patient  had eye deviation to the right-concerning for seizures like episode. Patient was given IV fluid of 500 cc, Ativan, Keppra of 1000 mg, Rocephin and azithromycin in ED. [facility] consulted for admission for altered mental status/aspiration pneumonia.  Assessment: - aspiration pneumonia - acute hypoxic respiratory failure with hypoxia  - toxic encephalopathy - post poliomyelitis with severe muscular atrophy - quadriplegia  Recommendations/Plan: - continue full comfort care - O2 can be titrated down as tolerated for comfort (turned down to 1L by this NP)  Do not check sats. Do not increase O2. May give benzos and/or opioids for shortness of breath, dyspnea, increased work of breathing or respiratory rate over 22.   Symptom Management:  Per end of life order set Morphine prn for pain or shortness of breath Lorazepam (ATIVAN) prn for anxiety Glycopyrrolate (ROBINUL) for excessive secretions Ondansetron (ZOFRAN) prn for nausea   Code Status: DNR/DNI  Prognosis: hours  Discharge Planning: Anticipated Hospital Death    Thank you for allowing the Palliative Medicine Team to assist in the care of this patient.   Total Time 15 minutes Prolonged Time Billed  no       Greater than 50%  of this time was spent counseling and coordinating  care related to the above assessment and plan.  Merry Proud, NP  Please contact Palliative Medicine Team phone at [phone] for questions and concerns.

## 2020-03-29 NOTE — Significant Event (Signed)
Damon Colon was on comfort care and has passed peacefully. Time of death 22:15 on 03/31/2020.

## 2020-03-29 NOTE — Progress Notes (Signed)
Palliative Medicine RN Note: Sister Harriett Sine called to add visitors to the list; I added the names to the flowsheet.  Margret Chance Raigan Baria, RN, BSN, [facility] Palliative Medicine Team 02/29/2020 11:03 AM Office [phone]

## 2020-03-29 NOTE — Death Summary Note (Addendum)
DEATH SUMMARY   Patient Details  Name: NALU TROUBLEFIELD MRN: [id number] DOB: 11-01-1940  Admission/Discharge Information   Admit Date:  03-26-20  Date of Death: Date of Death: March 29, 2020  Time of Death: Time of Death: 09-23-13  Length of Stay: 3  Referring Physician: Catha Gosselin, MD   Reason(s) for Hospitalization  decreased responsiveness, choking and shortness of breath.  Diagnoses  Preliminary cause of death:  Secondary Diagnoses (including complications and co-morbidities):  Principal Problem:   Acute respiratory failure with hypoxia (HCC) Active Problems:   Neurogenic bladder disorder   Quadriplegia and quadriparesis (HCC)   Hypertension   Aspiration pneumonia (HCC)   Thrombocytosis   End of life care  Brief Hospital Course (including significant findings, care, treatment, and services provided and events leading to death)   This is a chronically ill extremely debilitated 79/M with history of polio, post poliomyelitis, severe muscular atrophy, quadriplegia, urinary retention chronic Foley, contracted extremities, DVT was brought to the emergency room 2023/03/27 with decreased responsiveness, choking and shortness of breath. -In the emergency room he was noted to be febrile to 101, hypoxic requiring a nonrebreather mask, WBC of 13.6, lactic acid of 4.0, chest x-ray concerning for right lower lobe pneumonia possibly aspiration, EDP also asked neurology to see him due to right eye deviation and decreased responsiveness. he was given a dose of Keppra Ativan, IVF and antibiotics. Admitted to hospitalist service.  Assessment & Plan: Septic shock - POA secondary to aspiration pneumonia and Pseudomonas UTI Acute hypoxic respiratory failure -Presented with choking, shortness of breath, decreased responsiveness. -WBC count elevated to 42,000, lactic acid elevated to 4. -Initially treated with IV antibiotics. -Resolved poor prognosis, palliative care consultation was obtained -Prior to  admission, patient was already extremely debilitated quadriplegic, severe muscular atrophy and dysphagia -He was followed by palliative care at home, according to sister he is cared for 24/7 by caregivers he was declining, losing weight and was bedbound for the past several months. -With palliative care consultation in the hospital,, initially hospice at home was being considered however with further decline, in-hospital mortality was anticipated.  He was hence transitioned to comfort measures. -He expired on 03-29-2020 at 10:15 PM.  Other associated issues over Pseudomonas UTI associated with the chronic foley catheter -more than 100,000 CFU per mL of Pseudomonas aeruginosa obtained on urine culture. Toxic encephalopathy 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 History of neurogenic bladder History of DVT Severe protein calorie malnutrition  Pertinent Labs and Studies  Significant Diagnostic Studies CT Head Wo Contrast  Result Date: 2020/03/26 CLINICAL DATA:  Mental status change, unknown cause. EXAM: CT HEAD WITHOUT CONTRAST TECHNIQUE: Contiguous axial images were obtained from the base of the skull through the vertex without intravenous contrast. COMPARISON:  12/06/2018 CT head and prior. FINDINGS: Brain: No acute infarct or intracranial hemorrhage. No mass lesion. No midline shift, ventriculomegaly or extra-axial fluid collection. Chronic microvascular ischemic changes, similar to prior exam. Vascular: No hyperdense vessel or unexpected calcification. Bilateral skull base atherosclerotic calcifications. Skull: Negative for fracture or focal lesion. Sinuses/Orbits: Normal orbits. Clear paranasal sinuses. No mastoid effusion. Other: None. IMPRESSION: No acute intracranial process. Chronic microvascular ischemic changes, similar to prior exam. Electronically Signed   By: Stana Bunting M.D.   On: 2020-03-26 11:51   DG CHEST PORT 1 VIEW  Result Date:  03/26/2020 CLINICAL DATA:  Hypoxia EXAM: PORTABLE CHEST 1 VIEW COMPARISON:  03-26-20 FINDINGS: Heart is normal size. There is hyperinflation of the lungs compatible with COPD. Aortic atherosclerosis.  Patchy bilateral perihilar opacities. No effusions. No acute bony abnormality. IMPRESSION: Hyperinflation/COPD. Patchy bilateral perihilar opacities concerning for pneumonia. Electronically Signed   By: Charlett NoseKevin  Dover M.D.   On: August 01, 2019 22:41   DG Chest Portable 1 View  Result Date: August 01, 2019 CLINICAL DATA:  Aspiration EXAM: PORTABLE CHEST 1 VIEW COMPARISON:  07/15/2019 FINDINGS: Mild patchy opacities in the right upper and lower lobes, suspicious for pneumonia. Left lung is essentially clear. No pleural effusion or pneumothorax. The heart is normal in size.  Thoracic aortic atherosclerosis. S shaped thoracolumbar scoliosis. Cervical spine fixation hardware, incompletely visualized. IMPRESSION: Mild patchy right lung opacities, suspicious for pneumonia in this patient with history of aspiration. Electronically Signed   By: Charline BillsSriyesh  Krishnan M.D.   On: August 01, 2019 10:47   EEG adult  Result Date: August 01, 2019 Charlsie QuestYadav, Priyanka O, MD     03/15/2020  9:10 AM Patient Name: Kennon RoundsRobert B Drummonds MRN: [id number] Epilepsy Attending: Charlsie QuestPriyanka O Yadav Referring Physician/Provider: Dr Erick BlinksSalman Khaliqdina Date: August 01, 2019 Duration: 24.36 mins Patient history: 79yo M with ams and right gaze deviation. EEf to evaluate for seizure. Level of alertness: comatose AEDs during EEG study: None Technical aspects: This EEG study was done with scalp electrodes positioned according to the 10-20 International system of electrode placement. Electrical activity was acquired at a sampling rate of 500Hz  and reviewed with a high frequency filter of 70Hz  and a low frequency filter of 1Hz . EEG data were recorded continuously and digitally stored. Description: No posterior dominant rhythm was seen. EEG showed continuous generalized 3 to 6 Hz  theta-delta slowing. Hyperventilation and photic stimulation were not performed.   ABNORMALITY -Continuous slow, generalized IMPRESSION: This study is suggestive of severe diffuse encephalopathy, nonspecific etiology. No seizures or epileptiform discharges were seen throughout the recording. Priyanka Annabelle Harman Yadav   Overnight EEG with video  Result Date: 03/15/2020 Charlsie QuestYadav, Priyanka O, MD     03/15/2020  2:26 PM Patient Name: Kennon RoundsRobert B Sherrin MRN: [id number] Epilepsy Attending: Charlsie QuestPriyanka O Yadav Referring Physician/Provider: Dr Erick BlinksSalman Khaliqdina Duration: August 01, 2019 1448 to 03/15/2020 1326  Patient history: 79yo M with ams and right gaze deviation. EEf to evaluate for seizure.  Level of alertness: awake, asleep  AEDs during EEG study: None  Technical aspects: This EEG study was done with scalp electrodes positioned according to the 10-20 International system of electrode placement. Electrical activity was acquired at a sampling rate of 500Hz  and reviewed with a high frequency filter of 70Hz  and a low frequency filter of 1Hz . EEG data were recorded continuously and digitally stored.  Description: No posterior dominant rhythm was seen.Sleep was characterized by vertex waves,sleep spindles (12-14hz ), maximal frontocentral region.  EEG showed continuous generalized 3 to 6 Hz theta-delta slowing. Left temporal spikes were also noted. Hyperventilation and photic stimulation were not performed.    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 No seizures were seen throughout the recording.  Charlsie QuestPriyanka O Yadav    Microbiology Recent Results (from the past 240 hour(s))  Blood culture (routine x 2)     Status: None   Collection Time: 08/15/19 10:42 AM   Specimen: BLOOD  Result Value Ref Range Status   Specimen Description  BLOOD RIGHT ANTECUBITAL  Final   Special Requests   Final    BOTTLES DRAWN AEROBIC AND ANAEROBIC Blood Culture results may not be optimal due to an excessive volume of  blood received in culture bottles   Culture   Final    NO GROWTH 5 DAYS Performed at [facility], [address], [place], [place] [address]    Report Status 03/19/2020 FINAL  Final  Blood culture (routine x 2)     Status: None   Collection Time: 2020-04-11 10:55 AM   Specimen: BLOOD RIGHT WRIST  Result Value Ref Range Status   Specimen Description BLOOD RIGHT WRIST  Final   Special Requests   Final    BOTTLES DRAWN AEROBIC AND ANAEROBIC Blood Culture results may not be optimal due to an inadequate volume of blood received in culture bottles   Culture   Final    NO GROWTH 5 DAYS Performed at [facility], [address], [place], [place] [address]    Report Status 03/19/2020 FINAL  Final  Urine culture     Status: Abnormal   Collection Time: Apr 11, 2020 11:20 AM   Specimen: In/Out Cath Urine  Result Value Ref Range Status   Specimen Description IN/OUT CATH URINE  Final   Special Requests   Final    NONE Performed at [facility], [address] [place], [place] [address]    Culture >=100,000 COLONIES/mL PSEUDOMONAS AERUGINOSA (A)  Final   Report Status 03/16/2020 FINAL  Final   Organism ID, Bacteria PSEUDOMONAS AERUGINOSA (A)  Final      Susceptibility   Pseudomonas aeruginosa - MIC*    CEFTAZIDIME 4 SENSITIVE Sensitive     CIPROFLOXACIN <=0.25 SENSITIVE Sensitive     GENTAMICIN <=1 SENSITIVE Sensitive     IMIPENEM 2 SENSITIVE Sensitive     PIP/TAZO 16 SENSITIVE Sensitive     CEFEPIME 2 SENSITIVE Sensitive     * >=100,000 COLONIES/mL PSEUDOMONAS AERUGINOSA  Respiratory Panel by RT PCR (Flu A&B, Covid) - Nasopharyngeal Swab     Status: None   Collection Time: 04/11/20 11:25 AM   Specimen: Nasopharyngeal Swab  Result Value Ref Range Status   SARS Coronavirus 2 by RT PCR NEGATIVE NEGATIVE Final     Comment: (NOTE) SARS-CoV-2 target nucleic acids are NOT DETECTED.  The SARS-CoV-2 RNA is generally detectable in upper respiratoy specimens during the acute phase of infection. The lowest concentration of SARS-CoV-2 viral copies this assay can detect is 131 copies/mL. A negative result does not preclude SARS-Cov-2 infection and should not be used as the sole basis for treatment or other patient management decisions. A negative result may occur with  improper specimen collection/handling, submission of specimen other than nasopharyngeal swab, presence of viral mutation(s) within the areas targeted by this assay, and inadequate number of viral copies (<131 copies/mL). A negative result must be combined with clinical observations, patient history, and epidemiological information. The expected result is Negative.  Fact Sheet for Patients:  https://www.moore.com/  Fact Sheet for Healthcare Providers:  https://www.young.biz/  This test is no t yet approved or cleared by the [place] FDA and  has been authorized for detection and/or diagnosis of SARS-CoV-2 by FDA under an Emergency Use Authorization (EUA). This EUA will remain  in effect (meaning this test can be used) for the duration of the COVID-19 declaration under Section 564(b)(1) of the Act, 21 U.S.C. section 360bbb-3(b)(1), unless the authorization is terminated or revoked sooner.     Influenza A by PCR NEGATIVE NEGATIVE Final   Influenza B by PCR NEGATIVE NEGATIVE Final    Comment: (NOTE) The Xpert Xpress SARS-CoV-2/FLU/RSV assay is intended as an aid in  the diagnosis of influenza from Nasopharyngeal swab specimens and  should not be used as a sole basis for treatment. Nasal washings and  aspirates are unacceptable for Xpert Xpress SARS-CoV-2/FLU/RSV  testing.  Fact Sheet for Patients: https://www.moore.com/  Fact Sheet for Healthcare  Providers: https://www.young.biz/  This test is not yet approved or cleared by the [place] FDA and  has been authorized for detection and/or diagnosis of SARS-CoV-2 by  FDA under an Emergency Use Authorization (EUA). This EUA will remain  in effect (meaning this test can be used) for the duration of the  Covid-19 declaration under Section 564(b)(1) of the Act, 21  U.S.C. section 360bbb-3(b)(1), unless the authorization is  terminated or revoked. Performed at [facility], [address] [place], [place] [address]   MRSA PCR Screening     Status: Abnormal   Collection Time: 03/16/20  2:47 AM   Specimen: Nasal Mucosa; Nasopharyngeal  Result Value Ref Range Status   MRSA by PCR POSITIVE (A) NEGATIVE Final    Comment:        The GeneXpert MRSA Assay (FDA approved for NASAL specimens only), is one component of a comprehensive MRSA colonization surveillance program. It is not intended to diagnose MRSA infection nor to guide or monitor treatment for MRSA infections. RESULT CALLED TO, READ BACK BY AND VERIFIED WITH: MARSH,M RN 03/16/2020 AT 1607 SKEEN,P Performed at [facility], [address], [place], [place] [address]     Lab Basic Metabolic Panel: Recent Labs  Lab 30-Mar-2020 1015 2020-03-30 2242 03/15/20 0358 03/16/20 0535  NA 134* 135 140 147*  K 4.9 3.7 3.6 3.0*  CL 99  --  102 109  CO2 22  --  25 23  GLUCOSE 147*  --  127* 117*  BUN 15  --  12 18  CREATININE 0.43*  --  0.43* 0.62  CALCIUM 9.8  --  9.2 9.4  MG 2.2  --   --   --    Liver Function Tests: Recent Labs  Lab 03/15/20 0358 03/16/20 0535  AST 36 24  ALT 22 20  ALKPHOS 54 51  BILITOT 0.7 1.1  PROT 6.9 6.6  ALBUMIN 2.8* 2.6*   No results for input(s): LIPASE, AMYLASE in the last 168 hours. Recent Labs  Lab March 30, 2020 1829  AMMONIA 21   CBC: Recent Labs  Lab 03/30/2020 1015 03-30-20 2242 03/15/20 0909 03/16/20 0535  WBC 13.6*  --  39.5* 42.6*  NEUTROABS  6.3  --   --   --   HGB 12.5* 12.6* 11.5* 10.4*  HCT 41.2 37.0* 36.8* 33.9*  MCV 101.7*  --  98.4 99.1  PLT 429*  --  471* 474*   Cardiac Enzymes: No results for input(s): CKTOTAL, CKMB, CKMBINDEX, TROPONINI in the last 168 hours. Sepsis Labs: Recent Labs  Lab 03-30-2020 1015 March 30, 2020 1042 30-Mar-2020 1829 03/15/20 0909 03/16/20 0535  WBC 13.6*  --   --  39.5* 42.6*  LATICACIDVEN  --  4.0* 1.0  --   --     Procedures/Operations     Teylor Wolven 03/19/2020, 4:59 PM

## 2020-03-29 NOTE — Care Plan (Signed)
Pt. Has rested comfortably throughout the day and does not appear to be in any stress.  He has had several family members come and say goodbye.  He does not respond to voice or pain.  Frequently visit pt. Room to ensure he is comfortable.

## 2020-03-29 NOTE — Progress Notes (Signed)
Patient assessed.  Reviewed LPN documentation, and agree with the findings.  

## 2020-03-29 NOTE — Progress Notes (Signed)
PROGRESS NOTE    Kennon RoundsRobert B Lehnen  WUJ:[id number] DOB: 05/12/1941 DOA: 03/16/2020 PCP: Catha GosselinLittle, Kevin, MD  Brief Narrative: This is a chronically ill extremely debilitated 79/M with history of polio, post poliomyelitis, severe muscular atrophy, quadriplegia, urinary retention chronic Foley, contracted extremities, DVT was brought to the emergency room 10/17 with decreased responsiveness, choking and shortness of breath. -In the emergency room he was noted to be febrile to 101, hypoxic requiring a nonrebreather mask, WBC of 13.6, lactic acid of 4.0, chest x-ray concerning for right lower lobe pneumonia possibly aspiration, EDP also asked neurology to see him due to right eye deviation and decreased responsiveness yesterday he was given a dose of Keppra Ativan, IVF and antibiotics   Assessment & Plan:   Sepsis, poa Acute hypoxic respiratory failure Aspiration pneumonia -Patient is extremely debilitated quadriplegic, severe muscular atrophy and dysphagia -Initially treated with IV Unasyn, antibiotics, white count up to 42K, but poorly responsive and tachypneic -Palliative care also consulted and following, prognosis is poor -followed by palliative care at home, according to sister he is cared for 24/7 by caregivers he has been declining, losing weight and has been bedbound for the past several months, initially hospice at home was being considered however with further decline he has just been transitioned to comfort measures, anticipate hospital death  Toxic encephalopathy -This is likely secondary to sepsis, aspiration pneumonia -EEG  w/ temporal lobe spike, started on keppra per Neuro -now comfort care  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 -Now comfort care  History of neurogenic bladder -Has a chronic Foley  History of DVT -On Xarelto at baseline, was started on heparin on admission, will discontinue  Severe protein calorie malnutrition  Remains  inpatient appropriate because:Inpatient level of care appropriate due to severity of illness  Dispo: The patient is from: Home              Anticipated d/c is to: Anticipate hospital death              Anticipated d/c date is:  Hours to days              Patient currently is not medically stable to d/c.    Consultants:  SLP Palliative   Procedures:   Antimicrobials:    Subjective: -Poorly responsive, had apneic spells overnight, remains tachypneic  Objective: Vitals:   03/16/20 0800 03/16/20 1144 03/16/20 2119 2019/08/26 0426  BP: (!) 135/59 (!) 125/55 (!) 71/37 (!) 67/38  Pulse: (!) 109 (!) 111 (!) 103 96  Resp: (!) 21 (!) 30 (!) 25 (!) 29  Temp:  99.8 F (37.7 C) 99.5 F (37.5 C) 98.5 F (36.9 C)  TempSrc:  Axillary Axillary Axillary  SpO2: 97% 95% 99% 98%  Weight:      Height:        Intake/Output Summary (Last 24 hours) at 11-27-2019 1834 Last data filed at 03/16/2020 1908 Gross per 24 hour  Intake --  Output 450 ml  Net -450 ml   Filed Weights   03/24/2020 2155 03/16/20 0308  Weight: 72.6 kg 44.3 kg    Examination:  General exam: Chronically ill extremely cachectic male laying in bed, poorly responsive I did not do detailed examination because of comfort care status.  Data Reviewed:   CBC: Recent Labs  Lab 03/26/2020 1015 03/05/2020 2242 03/15/20 0909 03/16/20 0535  WBC 13.6*  --  39.5* 42.6*  NEUTROABS 6.3  --   --   --   HGB 12.5* 12.6* 11.5*  10.4*  HCT 41.2 37.0* 36.8* 33.9*  MCV 101.7*  --  98.4 99.1  PLT 429*  --  471* 474*   Basic Metabolic Panel: Recent Labs  Lab 03-19-20 1015 2020/03/19 2242 03/15/20 0358 03/16/20 0535  NA 134* 135 140 147*  K 4.9 3.7 3.6 3.0*  CL 99  --  102 109  CO2 22  --  25 23  GLUCOSE 147*  --  127* 117*  BUN 15  --  12 18  CREATININE 0.43*  --  0.43* 0.62  CALCIUM 9.8  --  9.2 9.4  MG 2.2  --   --   --    GFR: Estimated Creatinine Clearance: 46.1 mL/min (by C-G formula based on SCr of 0.62  mg/dL). Liver Function Tests: Recent Labs  Lab 03/15/20 0358 03/16/20 0535  AST 36 24  ALT 22 20  ALKPHOS 54 51  BILITOT 0.7 1.1  PROT 6.9 6.6  ALBUMIN 2.8* 2.6*   No results for input(s): LIPASE, AMYLASE in the last 168 hours. Recent Labs  Lab 03-19-20 1829  AMMONIA 21   Coagulation Profile: Recent Labs  Lab 03/19/2020 1042  INR 1.4*   Cardiac Enzymes: No results for input(s): CKTOTAL, CKMB, CKMBINDEX, TROPONINI in the last 168 hours. BNP (last 3 results) No results for input(s): PROBNP in the last 8760 hours. HbA1C: No results for input(s): HGBA1C in the last 72 hours. CBG: No results for input(s): GLUCAP in the last 168 hours. Lipid Profile: No results for input(s): CHOL, HDL, LDLCALC, TRIG, CHOLHDL, LDLDIRECT in the last 72 hours. Thyroid Function Tests: No results for input(s): TSH, T4TOTAL, FREET4, T3FREE, THYROIDAB in the last 72 hours. Anemia Panel: No results for input(s): VITAMINB12, FOLATE, FERRITIN, TIBC, IRON, RETICCTPCT in the last 72 hours. Urine analysis:    Component Value Date/Time   COLORURINE RED (A) 2020-03-19 1830   APPEARANCEUR TURBID (A) 2020-03-19 1830   LABSPEC  03/19/2020 1830    TEST NOT REPORTED DUE TO COLOR INTERFERENCE OF URINE PIGMENT   PHURINE  Mar 19, 2020 1830    TEST NOT REPORTED DUE TO COLOR INTERFERENCE OF URINE PIGMENT   GLUCOSEU (A) 19-Mar-2020 1830    TEST NOT REPORTED DUE TO COLOR INTERFERENCE OF URINE PIGMENT   HGBUR (A) Mar 19, 2020 1830    TEST NOT REPORTED DUE TO COLOR INTERFERENCE OF URINE PIGMENT   BILIRUBINUR (A) 19-Mar-2020 1830    TEST NOT REPORTED DUE TO COLOR INTERFERENCE OF URINE PIGMENT   KETONESUR (A) 03-19-20 1830    TEST NOT REPORTED DUE TO COLOR INTERFERENCE OF URINE PIGMENT   PROTEINUR (A) 03-19-20 1830    TEST NOT REPORTED DUE TO COLOR INTERFERENCE OF URINE PIGMENT   NITRITE (A) 03-19-2020 1830    TEST NOT REPORTED DUE TO COLOR INTERFERENCE OF URINE PIGMENT   LEUKOCYTESUR (A) Mar 19, 2020 1830    NO  FORMED ELEMENTS SEEN ON URINE MICROSCOPIC EXAMINATION   Sepsis Labs: @LABRCNTIP (procalcitonin:4,lacticidven:4)  ) Recent Results (from the past 240 hour(s))  Blood culture (routine x 2)     Status: None (Preliminary result)   Collection Time: 03-19-2020 10:42 AM   Specimen: BLOOD  Result Value Ref Range Status   Specimen Description BLOOD RIGHT ANTECUBITAL  Final   Special Requests   Final    BOTTLES DRAWN AEROBIC AND ANAEROBIC Blood Culture results may not be optimal due to an excessive volume of blood received in culture bottles   Culture   Final    NO GROWTH 3 DAYS Performed at [facility], [address] [place], [place] [address]    Report Status PENDING  Incomplete  Blood culture (routine x 2)     Status: None (Preliminary result)   Collection Time: 03/07/2020 10:55 AM   Specimen: BLOOD RIGHT WRIST  Result Value Ref Range Status   Specimen Description BLOOD RIGHT WRIST  Final   Special Requests   Final    BOTTLES DRAWN AEROBIC AND ANAEROBIC Blood Culture results may not be optimal due to an inadequate volume of blood received in culture bottles   Culture   Final    NO GROWTH 3 DAYS Performed at [facility], [address], [place], [place] [address]    Report Status PENDING  Incomplete  Urine culture     Status: Abnormal   Collection Time: 03/16/2020 11:20 AM   Specimen: In/Out Cath Urine  Result Value Ref Range Status   Specimen Description IN/OUT CATH URINE  Final   Special Requests   Final    NONE Performed at [facility], [address], [place], [place] [address]    Culture >=100,000 COLONIES/mL PSEUDOMONAS AERUGINOSA (A)  Final   Report Status 03/16/2020 FINAL  Final   Organism ID, Bacteria PSEUDOMONAS AERUGINOSA (A)  Final      Susceptibility   Pseudomonas aeruginosa - MIC*    CEFTAZIDIME 4 SENSITIVE Sensitive     CIPROFLOXACIN <=0.25 SENSITIVE Sensitive     GENTAMICIN <=1 SENSITIVE Sensitive     IMIPENEM 2 SENSITIVE Sensitive     PIP/TAZO  16 SENSITIVE Sensitive     CEFEPIME 2 SENSITIVE Sensitive     * >=100,000 COLONIES/mL PSEUDOMONAS AERUGINOSA  Respiratory Panel by RT PCR (Flu A&B, Covid) - Nasopharyngeal Swab     Status: None   Collection Time: 03/12/2020 11:25 AM   Specimen: Nasopharyngeal Swab  Result Value Ref Range Status   SARS Coronavirus 2 by RT PCR NEGATIVE NEGATIVE Final    Comment: (NOTE) SARS-CoV-2 target nucleic acids are NOT DETECTED.  The SARS-CoV-2 RNA is generally detectable in upper respiratoy specimens during the acute phase of infection. The lowest concentration of SARS-CoV-2 viral copies this assay can detect is 131 copies/mL. A negative result does not preclude SARS-Cov-2 infection and should not be used as the sole basis for treatment or other patient management decisions. A negative result may occur with  improper specimen collection/handling, submission of specimen other than nasopharyngeal swab, presence of viral mutation(s) within the areas targeted by this assay, and inadequate number of viral copies (<131 copies/mL). A negative result must be combined with clinical observations, patient history, and epidemiological information. The expected result is Negative.  Fact Sheet for Patients:  https://www.moore.com/  Fact Sheet for Healthcare Providers:  https://www.young.biz/  This test is no t yet approved or cleared by the [place] FDA and  has been authorized for detection and/or diagnosis of SARS-CoV-2 by FDA under an Emergency Use Authorization (EUA). This EUA will remain  in effect (meaning this test can be used) for the duration of the COVID-19 declaration under Section 564(b)(1) of the Act, 21 U.S.C. section 360bbb-3(b)(1), unless the authorization is terminated or revoked sooner.     Influenza A by PCR NEGATIVE NEGATIVE Final   Influenza B by PCR NEGATIVE NEGATIVE Final    Comment: (NOTE) The Xpert Xpress SARS-CoV-2/FLU/RSV assay is  intended as an aid in  the diagnosis of influenza from Nasopharyngeal swab specimens and  should not be used as a sole basis for treatment. Nasal  washings and  aspirates are unacceptable for Xpert Xpress SARS-CoV-2/FLU/RSV  testing.  Fact Sheet for Patients: https://www.moore.com/  Fact Sheet for Healthcare Providers: https://www.young.biz/  This test is not yet approved or cleared by the [place] FDA and  has been authorized for detection and/or diagnosis of SARS-CoV-2 by  FDA under an Emergency Use Authorization (EUA). This EUA will remain  in effect (meaning this test can be used) for the duration of the  Covid-19 declaration under Section 564(b)(1) of the Act, 21  U.S.C. section 360bbb-3(b)(1), unless the authorization is  terminated or revoked. Performed at [facility], [address], [place], [place] [address]   MRSA PCR Screening     Status: Abnormal   Collection Time: 03/16/20  2:47 AM   Specimen: Nasal Mucosa; Nasopharyngeal  Result Value Ref Range Status   MRSA by PCR POSITIVE (A) NEGATIVE Final    Comment:        The GeneXpert MRSA Assay (FDA approved for NASAL specimens only), is one component of a comprehensive MRSA colonization surveillance program. It is not intended to diagnose MRSA infection nor to guide or monitor treatment for MRSA infections. RESULT CALLED TO, READ BACK BY AND VERIFIED WITH: MARSH,M RN 03/16/2020 AT 4967 SKEEN,P Performed at [facility], [address], [place], [place] [address]     Radiology Studies: No results found.      Scheduled Meds: Continuous Infusions: . levETIRAcetam Stopped (2020-04-12 1315)     LOS: 3 days    Time spent:  Lorin Glass, MD [facility]  04/12/2020, 6:34 PM

## 2020-03-29 NOTE — Progress Notes (Signed)
Received patient on 10/19/21at 1900. Patient was comfort care only, unresponsive, and comfortable, pt expired at 10.15PM. On call MD was notified. Next of kin was notified, declined to come see the patient. [facility] was called, spoke with Marica Otter Ref# [id number]. Pt remains were taken to morgue.

## 2020-03-29 DEATH — deceased

## 2021-11-04 IMAGING — CT CT HEAD W/O CM
4 series · 16 of 47 positions shown, 18 images · non-contrast
Comparison: 12/06/2018 CT head and prior.

CLINICAL DATA: Mental status change, unknown cause.

EXAM:
CT HEAD WITHOUT CONTRAST
TECHNIQUE: Contiguous axial images were obtained from the base of the skull
through the vertex without intravenous contrast.

[Series 3: head wo · axial · 0.42mm/px · z∈[-136,-10]mm · 7 of 35 slices shown, 9 images]
[im 5/35  brain]
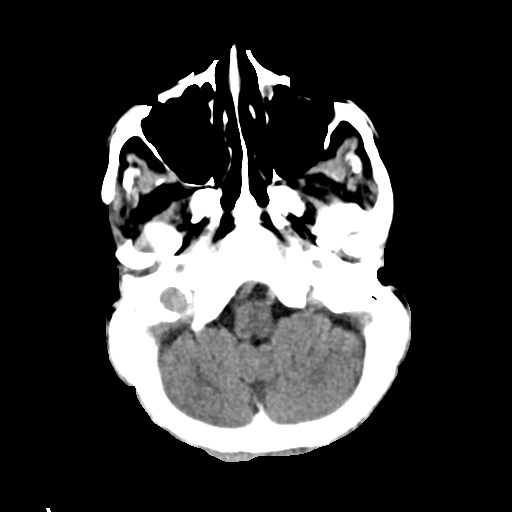
[im 5/35  bone]
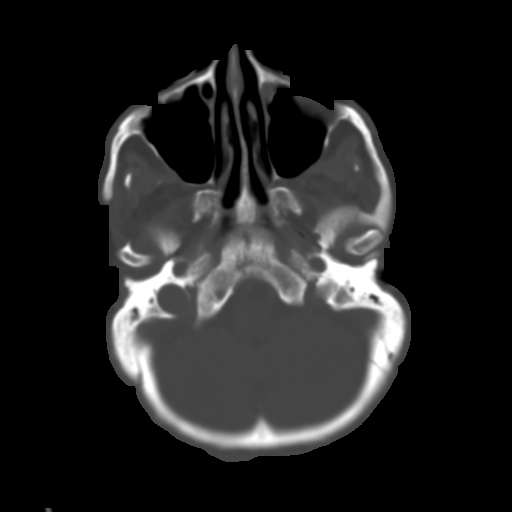
[im 9/35  brain]
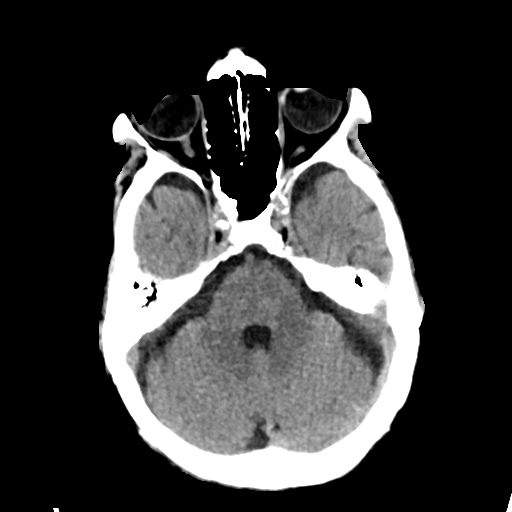
[im 13/35  brain]
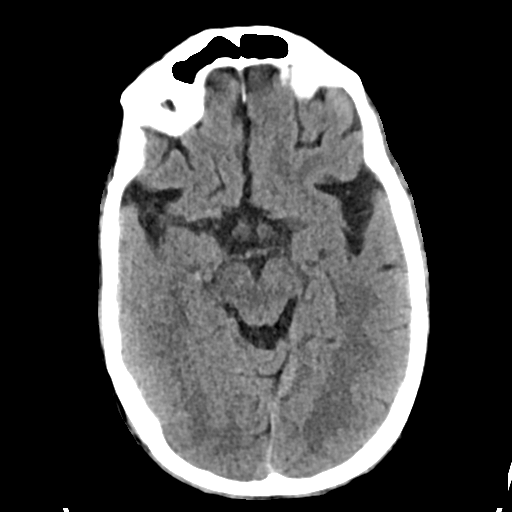
[im 18/35  brain]
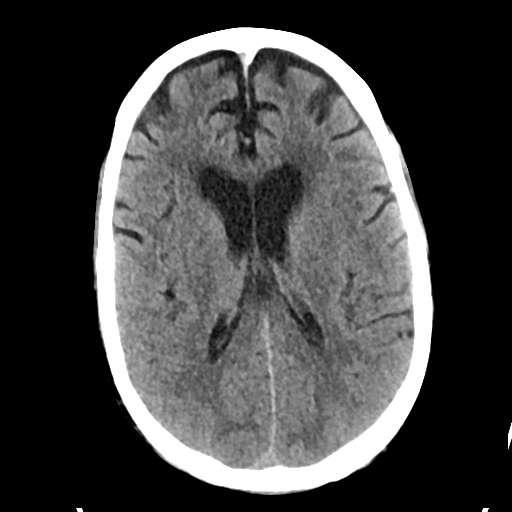
[im 22/35  brain]
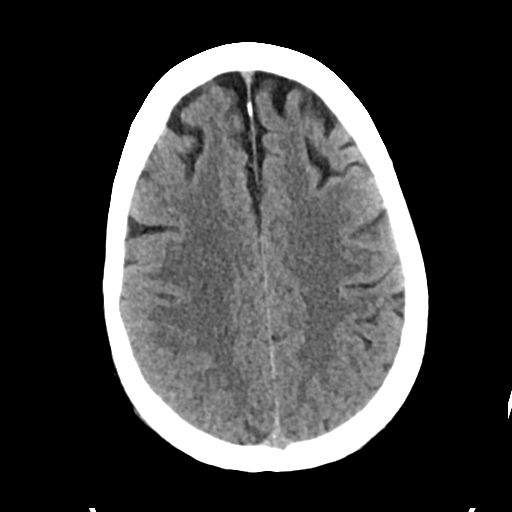
[im 22/35  bone]
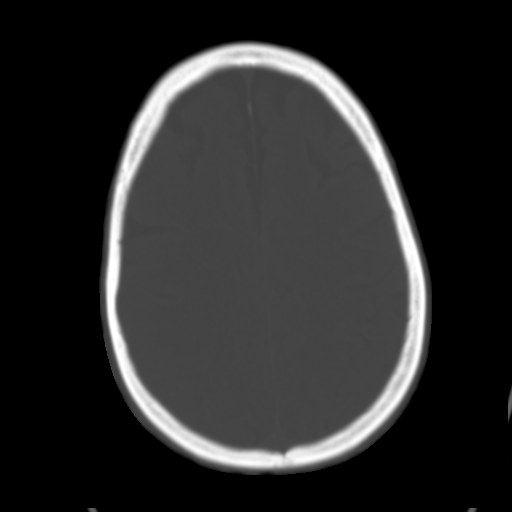
[im 26/35  brain]
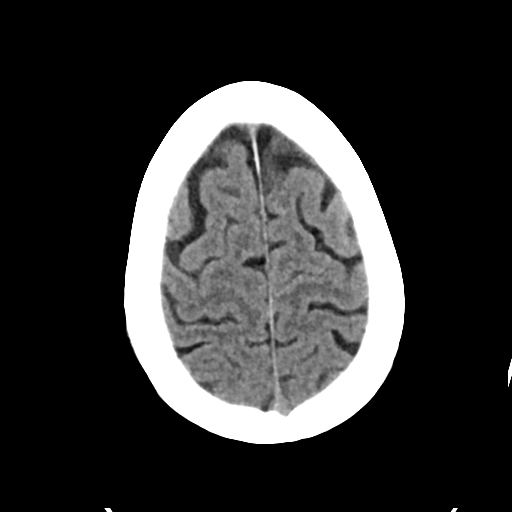
[im 30/35  brain]
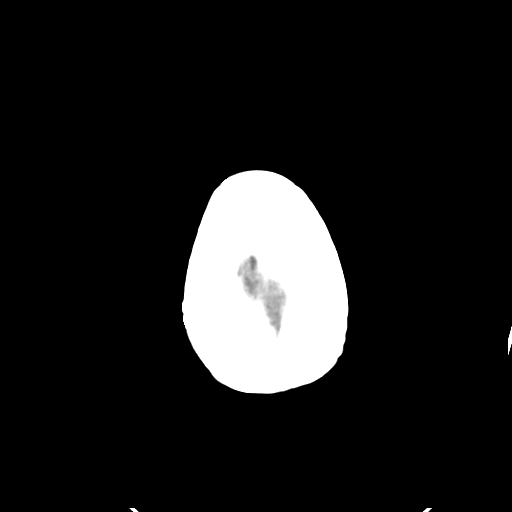

[Series 4: head bone · axial · 0.42mm/px · z∈[-140,-106]mm · 3 of 86 slices shown]
[im 9/86  bone]
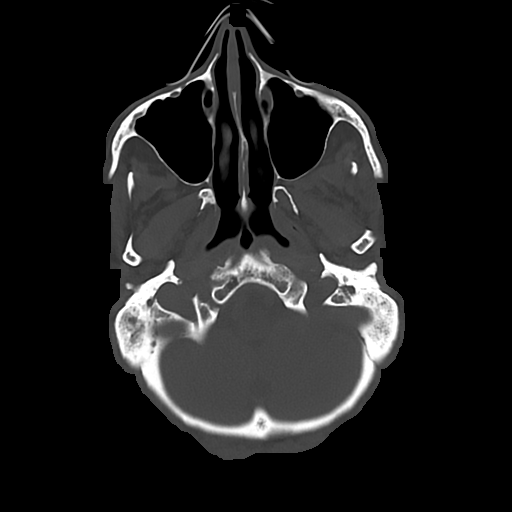
[im 18/86  bone]
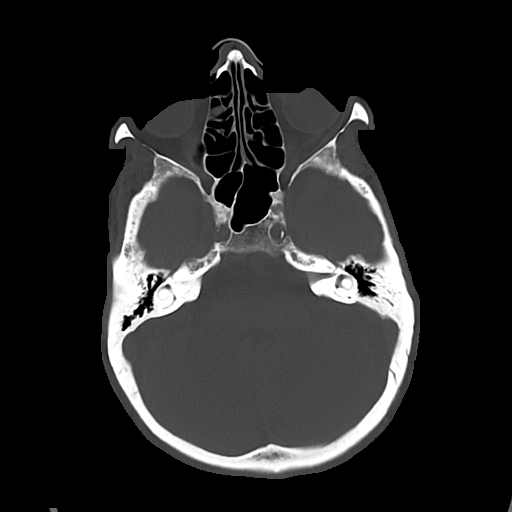
[im 26/86  bone]
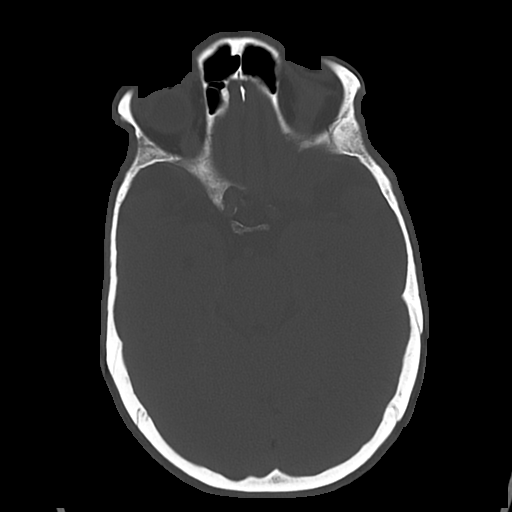

[Series 5: cor soft · coronal · 0.30mm/px · 3 of 71 slices shown]
[im 24/71  brain]
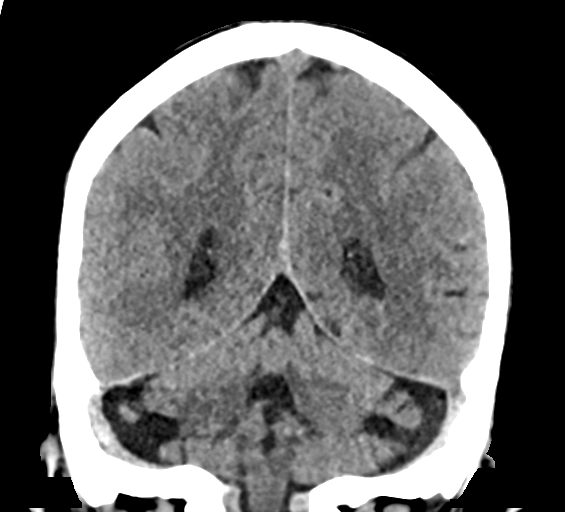
[im 32/71  brain]
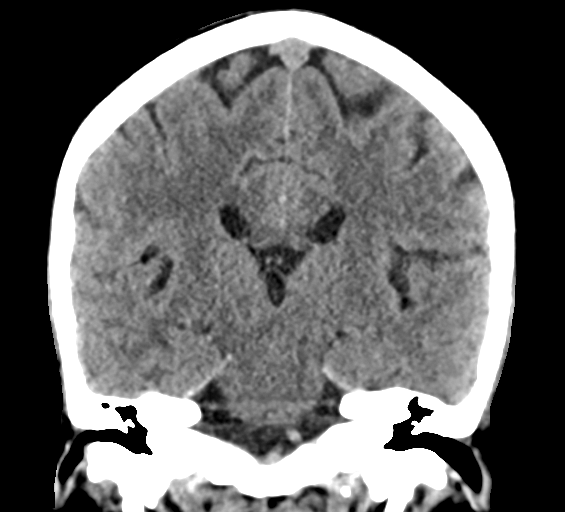
[im 39/71  brain]
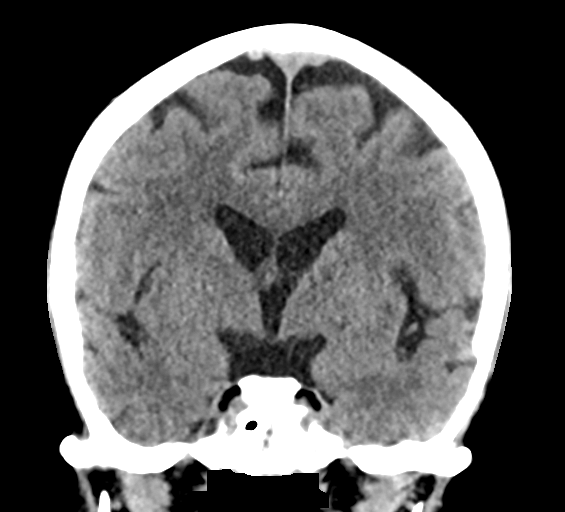

[Series 6: sag soft · sagittal · 0.30mm/px · 3 of 52 slices shown]
[im 18/52  brain]
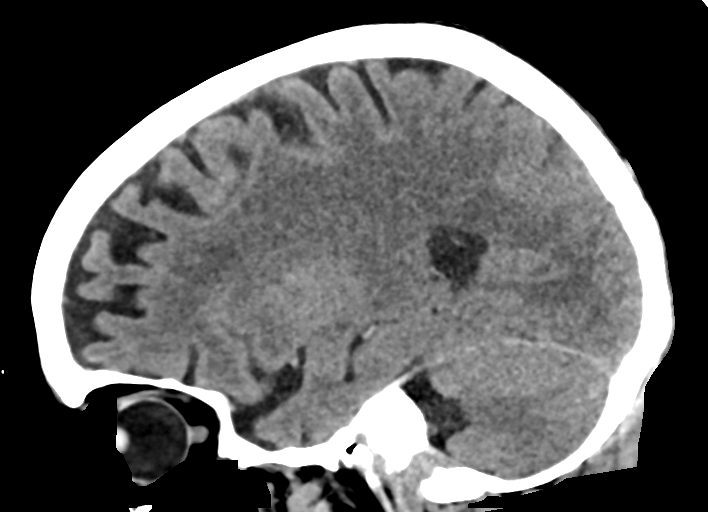
[im 26/52  brain]
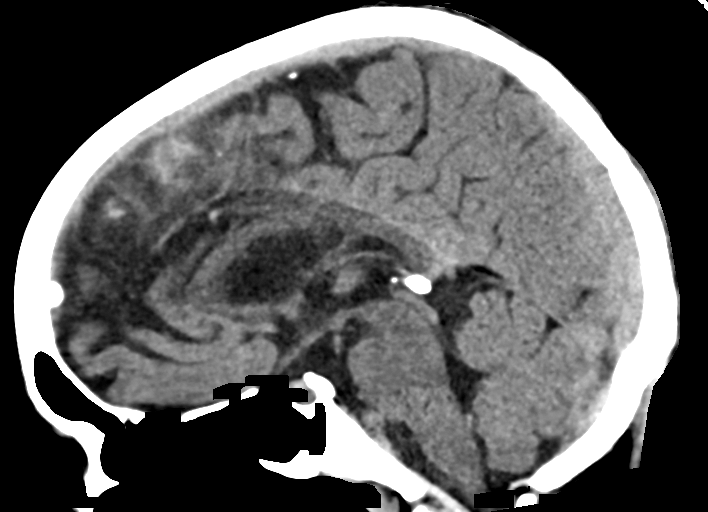
[im 35/52  brain]
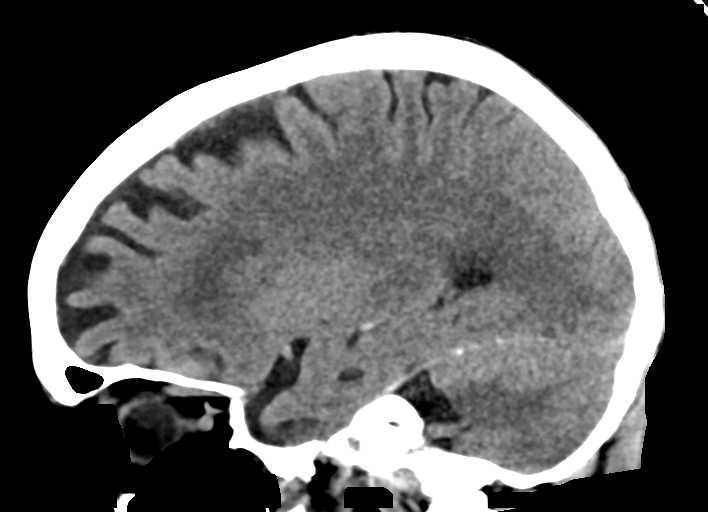

[16 of 47 positions shown; findings below may reference images not displayed]

FINDINGS: Brain: No acute infarct or intracranial hemorrhage. No mass lesion.
No midline shift, ventriculomegaly or extra-axial fluid collection.
Chronic microvascular ischemic changes, similar to prior exam.

Vascular: No hyperdense vessel or unexpected calcification.
Bilateral skull base atherosclerotic calcifications.

Skull: Negative for fracture or focal lesion.

Sinuses/Orbits: Normal orbits. Clear paranasal sinuses. No mastoid
effusion.

Other: None.
IMPRESSION: No acute intracranial process.

Chronic microvascular ischemic changes, similar to prior exam.
